# Patient Record
Sex: Female | Born: 1980 | Hispanic: No | Smoking: Former smoker
Health system: Southern US, Community
[De-identification: ages and names within clinical notes are randomized; demographics above are authoritative.]

## PROBLEM LIST (undated history)

## (undated) DIAGNOSIS — Z720 Tobacco use: Secondary | ICD-10-CM

## (undated) DIAGNOSIS — E282 Polycystic ovarian syndrome: Secondary | ICD-10-CM

## (undated) DIAGNOSIS — M1712 Unilateral primary osteoarthritis, left knee: Secondary | ICD-10-CM

## (undated) DIAGNOSIS — E785 Hyperlipidemia, unspecified: Secondary | ICD-10-CM

## (undated) DIAGNOSIS — F329 Major depressive disorder, single episode, unspecified: Secondary | ICD-10-CM

## (undated) DIAGNOSIS — E119 Type 2 diabetes mellitus without complications: Secondary | ICD-10-CM

## (undated) DIAGNOSIS — I10 Essential (primary) hypertension: Secondary | ICD-10-CM

## (undated) HISTORY — DX: Type 2 diabetes mellitus without complications: E11.9

## (undated) HISTORY — DX: Essential (primary) hypertension: I10

## (undated) HISTORY — DX: Unilateral primary osteoarthritis, left knee: M17.12

## (undated) HISTORY — PX: HERNIA REPAIR: SHX51

## (undated) HISTORY — DX: Tobacco use: Z72.0

## (undated) HISTORY — DX: Hyperlipidemia, unspecified: E78.5

## (undated) HISTORY — DX: Polycystic ovarian syndrome: E28.2

---
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
## 2010-09-15 ENCOUNTER — Ambulatory Visit: Admit: 2010-09-15 | Payer: Self-pay | Admitting: Family Medicine

## 2010-09-15 ENCOUNTER — Encounter: Payer: Self-pay | Admitting: Family Medicine

## 2010-09-15 ENCOUNTER — Ambulatory Visit
Admission: RE | Admit: 2010-09-15 | Discharge: 2010-09-15 | Payer: Self-pay | Source: Home / Self Care | Attending: Family Medicine | Admitting: Family Medicine

## 2010-09-15 DIAGNOSIS — N979 Female infertility, unspecified: Secondary | ICD-10-CM | POA: Insufficient documentation

## 2010-09-15 DIAGNOSIS — N912 Amenorrhea, unspecified: Secondary | ICD-10-CM

## 2010-09-20 LAB — CONVERTED CEMR LAB
ALT: 20 units/L (ref 0–35)
AST: 19 units/L (ref 0–37)
Albumin: 3.6 g/dL (ref 3.5–5.2)
Alkaline Phosphatase: 71 units/L (ref 39–117)
BUN: 8 mg/dL (ref 6–23)
CO2: 27 meq/L (ref 19–32)
Calcium: 8.7 mg/dL (ref 8.4–10.5)
Chloride: 103 meq/L (ref 96–112)
Creatinine, Ser: 0.64 mg/dL (ref 0.40–1.20)
DHEA-SO4: 116 ug/dL (ref 35–430)
Estradiol: 37 pg/mL
FSH: 5.7 milliintl units/mL
Glucose, Bld: 96 mg/dL (ref 70–99)
HCT: 40.4 % (ref 36.0–46.0)
Hemoglobin: 13.3 g/dL (ref 12.0–15.0)
Hgb A1c MFr Bld: 6.3 % — ABNORMAL HIGH (ref <5.7)
LH: 7.2 milliintl units/mL
MCHC: 32.9 g/dL (ref 30.0–36.0)
MCV: 79.7 fL (ref 78.0–100.0)
Platelets: 263 10*3/uL (ref 150–400)
Potassium: 4.2 meq/L (ref 3.5–5.3)
Prolactin: 7.3 ng/mL
RBC: 5.07 M/uL (ref 3.87–5.11)
RDW: 15.2 % (ref 11.5–15.5)
Sodium: 140 meq/L (ref 135–145)
TSH: 4.15 microintl units/mL (ref 0.350–4.500)
Testosterone: 70.36 ng/dL — ABNORMAL HIGH (ref 10–70)
Total Bilirubin: 0.5 mg/dL (ref 0.3–1.2)
Total Protein: 7.1 g/dL (ref 6.0–8.3)
WBC: 9.8 10*3/uL (ref 4.0–10.5)

## 2010-10-03 ENCOUNTER — Other Ambulatory Visit: Payer: Self-pay | Admitting: Obstetrics & Gynecology

## 2010-10-03 ENCOUNTER — Ambulatory Visit
Admission: RE | Admit: 2010-10-03 | Discharge: 2010-10-03 | Payer: Self-pay | Source: Home / Self Care | Attending: Obstetrics & Gynecology | Admitting: Obstetrics & Gynecology

## 2010-10-19 NOTE — Assessment & Plan Note (Signed)
Summary: NOV: Amenorrhea   Vital Signs:  Patient profile:   30 year old female Height:      64 inches Weight:      326 pounds Pulse rate:   100 / minute BP sitting:   126 / 81  (right arm) Cuff size:   regular  Vitals Entered By: Avon Gully CMA, Duncan Dull) (September 15, 2010 8:34 AM) CC: NP-est care, trying to get preg,irreg periods   CC:  NP-est care, trying to get preg, and irreg periods.  History of Present Illness: NP-est care, trying to get preg,irreg periods. Has been trying to get pregnant for about a year.  WEnt 5 months without a periods. Then had a very light periods in October and then got a heavy period in December that lasted 2 weeks.  Inc hair growth.  Has had irregular periods most of her adult life. She does have a 31 year old and says didn't have a period for almost a year and then got pregnant.  She is not on a PNV. She recently got married and really would like to have another child. Has occ spotting after intercourse.   Habits & Providers  Alcohol-Tobacco-Diet     Alcohol drinks/day: 0     Tobacco Status: quit     Year Quit: 2011  Exercise-Depression-Behavior     Does Patient Exercise: yes     Type of exercise: zumba     Exercise (avg: min/session): 30-60     Times/week: 3     Drug Use: no     Seat Belt Use: always  Current Medications (verified): 1)  None  Allergies (verified): 1)  ! Amoxicillin (Amoxicillin) 2)  ! Penicillin G Potassium (Penicillin G Potassium) 3)  ! Erythromycin  Comments:  Nurse/Medical Assistant: The patient's medications and allergies were reviewed with the patient and were updated in the Medication and Allergy Lists. 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  Married to [place]  with one son.   Former Smoker Alcohol use-no Drug use-no Regular exercise-yes Smoking Status:  quit Does Patient Exercise:  yes Drug Use:  no Seat Belt Use:  always  Review of Systems       No fever/sweats/weakness, unexplained weight loss/gain.  No vison changes.  No difficulty hearing/ringing in ears, hay fever/allergies.  No chest pain/discomfort, palpitations.  No Br lump/nipple discharge.  No cough/wheeze.  No blood in BM, nausea/vomiting/diarrhea.  No nighttime urination, leaking urine, + unusual vaginal bleeding, no discharge (vagina).  No muscle/joint pain. No rash, change in mole.  No HA, memory loss.  No anxiety, sleep d/o, depression.  No easy bruising/bleeding, unexplained lump   Physical Exam  General:  Well-developed,well-nourished,in no acute distress; alert,appropriate and cooperative throughout examination. She is obese.  Neck:  No deformities, masses, or tenderness noted. NO TM.  Lungs:  Normal respiratory effort, chest expands symmetrically. Lungs are clear to auscultation, no crackles or wheezes. Heart:  Normal rate and regular rhythm. S1 and S2 normal without gallop, murmur, click, rub or other extra sounds. Abdomen:  Bowel sounds positive,abdomen soft and non-tender without masses, organomegaly or hernias noted. Skin:  no rashes.   Cervical Nodes:  No lymphadenopathy noted Psych:  Cognition and judgment appear intact. Alert and cooperative with normal attention span and concentration. No apparent  delusions, illusions, hallucinations   Impression & Recommendations:  Problem # 1:  AMENORRHEA (ICD-626.0) I suspect she really has PCOS since she has had irregular periods most of her life and she has been overweight most of her life.  Also she says she gets facial hair growth. Not sure if any insulin resistance but we will check. Also will check for anemia. She does have a family hx of thyroid d/o.  Will also refer to GYN since she has been trying for a year and she also  needs a pap. Offered to do one hear but likely gyn will want to do a pelvic exam on her anyway so will defer testing to them.  Orders: T-TSH [phone]) T-Comprehensive Metabolic Panel [phone]) T-CBC No Diff ([id number]) T- Hemoglobin A1C ([id number]) T-FSH [phone]) T-LH [phone]) T-Estradiol [phone]) T-Prolactin [phone]) T-Dehydroepiandrosterone-Sulfate (DHEA S) [phone]) T-Testosterone; Total [phone]) Gynecologic Referral (Gyn)  Patient Instructions: 1)  Start a once daily prenatla vitamin at bedtime 2)  Continue your exercise and healthy diet to try to lose weight.    Orders Added: 1)  T-TSH [[id number]] 2)  T-Comprehensive Metabolic Panel [[id number]] 3)  T-CBC No Diff [[id number]] 4)  T- Hemoglobin A1C [83036-23375] 5)  T-FSH [83001-23670] 6)  T-LH [[id number]] 7)  T-Estradiol [82670-82425] 8)  T-Prolactin [35573-22025] 9)  T-Dehydroepiandrosterone-Sulfate (DHEA S) [[id number]] 10)  T-Testosterone; Total [phone] 11)  Gynecologic Referral [Gyn] 12)  New Patient Level III [99203]

## 2010-11-08 ENCOUNTER — Other Ambulatory Visit: Payer: Self-pay | Admitting: Obstetrics & Gynecology

## 2010-11-08 ENCOUNTER — Ambulatory Visit
Admission: RE | Admit: 2010-11-08 | Discharge: 2010-11-08 | Disposition: A | Discharge status: Home / Self Care | Payer: BC Managed Care – PPO | Source: Ambulatory Visit | Attending: Obstetrics & Gynecology | Admitting: Obstetrics & Gynecology

## 2010-11-08 ENCOUNTER — Ambulatory Visit: Payer: BC Managed Care – PPO | Admitting: Obstetrics & Gynecology

## 2010-11-08 DIAGNOSIS — N930 Postcoital and contact bleeding: Secondary | ICD-10-CM

## 2010-12-10 ENCOUNTER — Other Ambulatory Visit: Payer: Self-pay | Admitting: Family Medicine

## 2011-01-24 ENCOUNTER — Telehealth: Payer: Self-pay | Admitting: Family Medicine

## 2011-01-24 NOTE — Telephone Encounter (Signed)
Pt called and was diagnosed in Feb 2012 with polycystic ovarian syndrome and now 2 days ago was having sexual intercourse and had what is called normal bleeding with this diagnosis and today woke up and had sharp pain in lower abdominal area on RT side.  Ultrasound done in Frb 2012 and our office had referred pt to the OB-GYN. Plan:  Pt notified after discussing situation to call her OB)GYn provider to follow up.  Pt voiced understanding. Jarvis Newcomer, LPN Domingo Dimes

## 2011-01-30 NOTE — Assessment & Plan Note (Signed)
Deborah Carrillo, Deborah Carrillo              ACCOUNT NO.:  [id number]   MEDICAL RECORD NO.:  [id number]          PATIENT TYPE:  POB   LOCATION:  [facility] [place]         FACILITY:  [facility]   PHYSICIAN:  Allie Bossier, MD        DATE OF BIRTH:  Jan 30, 1981   DATE OF SERVICE:  10/03/2010                                  CLINIC NOTE   Ms. Kreager is a 30 year old married white G1, P1.  She has a 10-year-  old son named Immunologist.  She has been married to her current husband for  last three and half months and would like to achieve pregnancy.  She has  been having unprotected intercourse with this man (who has already  fathered a child 5 years ago) for the last approximately a year and 4  months.  She says her periods are extremely irregular and they have been  her whole life.  She reports less than 6 periods in 2011.  She saw Dr.  Linford Arnold recently and was given a diagnosis of high sugar and started  on metformin.  Dr. Linford Arnold mentioned that she perhaps has polycystic  ovarian syndrome.  The patient comes today also with the complaint of  some bleeding after intercourse.  She says it occurs usually the next  day or so.   PAST MEDICAL HISTORY:  Morbid obesity, what sounds like adult onset  diabetes and I am suspicious for history of PCOS.   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  She  reports dyspareunia with this partner but not with previous partners.   SOCIAL HISTORY:  Negative for tobacco, alcohol, or drug use.  Her last  Pap smear was more than 2 years ago.   ALLERGIES:  No latex allergies.  Drug allergies to PENICILLIN,  AMOXICILLIN, and ERYTHROMYCIN.   MEDICATIONS:  She was started on metformin 500 mg b.i.d. last week.  She  was started prenatal vitamins last week and she takes Aleve on a p.r.n.  (rare) basis.   FAMILY HISTORY:  She denies a family history of abnormal Pap smears.   PHYSICAL EXAMINATION:  VITAL SIGNS:  Height 5 feet 4 inches, weight 330  pounds,  blood pressure 140/89, pulse 71.  HEENT:  Normal.  BREAST:  No masses, no skin changes or nipple discharge.  ABDOMEN:  Morbidly obese.  No palpable hepatosplenomegaly.  There is no  excessive hirsutism on her abdomen.  EXTERNAL GENITALIA:  No lesions.  Cervix appears nulliparous with a  normal discharge.  She has normal estrogen effect.  Bimanual exam was  noncontributory due to her centripetal obesity.   ASSESSMENT AND PLAN:  1. Annual exam.  I have checked Pap smear.  Recommended self-breast      and self-vulvar exams.  I gave her handout on self-breast exam.  2. Morbid obesity.  I have discussed weight watchers and the fact that      weight loss will definitely improve her quality of life as well as      her longevity and her chance of fertility.  3. Desire for pregnancy.  I have recommended she return here next  month and that we will increase her metformin.  She understands      that in addition to lowering sugars, it also can be used as an      ovulation induction agent.      Allie Bossier, MD     MCD/MEDQ  D:  10/03/2010  T:  10/04/2010  Job:  [id number]

## 2011-04-30 ENCOUNTER — Ambulatory Visit (INDEPENDENT_AMBULATORY_CARE_PROVIDER_SITE_OTHER): Payer: BC Managed Care – PPO | Admitting: Family Medicine

## 2011-04-30 VITALS — BP 146/96 | HR 112 | Wt 315.0 lb

## 2011-04-30 DIAGNOSIS — Z3202 Encounter for pregnancy test, result negative: Secondary | ICD-10-CM

## 2011-04-30 DIAGNOSIS — Z3201 Encounter for pregnancy test, result positive: Secondary | ICD-10-CM

## 2011-04-30 LAB — POCT URINE PREGNANCY: Preg Test, Ur: POSITIVE

## 2011-04-30 NOTE — Progress Notes (Signed)
  Subjective:    Patient ID: Deborah Carrillo, female    DOB: 1981/03/25, 30 y.o.   MRN: [id number]  HPI Had 3 pos home preg tests and wanted confirmation from here.     Review of Systems     Objective:   Physical Exam        Assessment & Plan:  UPT + . LMP was Carrillo. Hx of PCOS. Will get quant.

## 2011-05-01 ENCOUNTER — Telehealth: Payer: Self-pay | Admitting: Family Medicine

## 2011-05-01 DIAGNOSIS — Z331 Pregnant state, incidental: Secondary | ICD-10-CM

## 2011-05-01 LAB — HCG, QUANTITATIVE, PREGNANCY: hCG, Beta Chain, Quant, S: 543.5 m[IU]/mL

## 2011-05-01 NOTE — Telephone Encounter (Signed)
Call patient: Her hCG is positive. Initially she is approximately in the 3 week range. We did schedule her for an ultrasound to further confirm her dating since her last period was in May.

## 2011-05-01 NOTE — Telephone Encounter (Signed)
Pt aware of the above  

## 2011-05-18 ENCOUNTER — Ambulatory Visit
Admission: RE | Admit: 2011-05-18 | Discharge: 2011-05-18 | Disposition: A | Discharge status: Home / Self Care | Payer: BC Managed Care – PPO | Source: Ambulatory Visit | Attending: Family Medicine | Admitting: Family Medicine

## 2011-05-18 ENCOUNTER — Other Ambulatory Visit: Payer: Self-pay | Admitting: Family Medicine

## 2011-05-18 ENCOUNTER — Telehealth: Payer: Self-pay | Admitting: Family Medicine

## 2011-05-18 DIAGNOSIS — Z331 Pregnant state, incidental: Secondary | ICD-10-CM

## 2011-05-18 DIAGNOSIS — Z3201 Encounter for pregnancy test, result positive: Secondary | ICD-10-CM

## 2011-05-18 NOTE — Telephone Encounter (Signed)
Call pt: Gestational sac present on [place] but no yolk sac. The recommend repeat US in one week to confim that the yolk sac is present.

## 2011-05-22 ENCOUNTER — Telehealth: Payer: Self-pay | Admitting: Family Medicine

## 2011-05-22 NOTE — Telephone Encounter (Signed)
Pt called and spoke with triage to get ultrasound results. Plan:  Pt notified by [facility] that gestational sac seen but need to repeat the test in 1 week to check for yolk sac.  Told to call with questions or concerns once message received. Jarvis Newcomer, LPN Domingo Dimes

## 2011-05-23 NOTE — Telephone Encounter (Signed)
Left message to call back  

## 2011-05-24 ENCOUNTER — Telehealth: Payer: Self-pay | Admitting: Family Medicine

## 2011-05-24 NOTE — Telephone Encounter (Signed)
Pt called and stated she has her second ultrasound set up for tomorrow. Jarvis Newcomer, LPN Domingo Dimes

## 2011-05-25 ENCOUNTER — Telehealth: Payer: Self-pay | Admitting: Family Medicine

## 2011-05-25 ENCOUNTER — Ambulatory Visit
Admission: RE | Admit: 2011-05-25 | Discharge: 2011-05-25 | Disposition: A | Discharge status: Home / Self Care | Payer: BC Managed Care – PPO | Source: Ambulatory Visit | Attending: Family Medicine | Admitting: Family Medicine

## 2011-05-25 DIAGNOSIS — O200 Threatened abortion: Secondary | ICD-10-CM

## 2011-05-25 DIAGNOSIS — Z3201 Encounter for pregnancy test, result positive: Secondary | ICD-10-CM

## 2011-05-25 NOTE — Telephone Encounter (Signed)
Call patient: The ultrasound shows that she has a failed pregnancy. I would like to refer her to OB down the hall. She will likely expect some spotting and bleeding in the next week or 2.

## 2011-05-25 NOTE — Telephone Encounter (Signed)
Pt.notified

## 2011-05-28 NOTE — Telephone Encounter (Signed)
Closed

## 2011-05-29 ENCOUNTER — Ambulatory Visit (INDEPENDENT_AMBULATORY_CARE_PROVIDER_SITE_OTHER): Payer: BC Managed Care – PPO | Admitting: Obstetrics & Gynecology

## 2011-05-29 ENCOUNTER — Other Ambulatory Visit: Payer: Self-pay | Admitting: Obstetrics & Gynecology

## 2011-05-29 ENCOUNTER — Encounter: Payer: Self-pay | Admitting: Obstetrics & Gynecology

## 2011-05-29 VITALS — BP 118/86 | HR 88 | Temp 98.5°F | Resp 17 | Ht 63.0 in | Wt 324.0 lb

## 2011-05-29 DIAGNOSIS — O021 Missed abortion: Secondary | ICD-10-CM

## 2011-05-29 MED ORDER — MISOPROSTOL 200 MCG PO TABS: ORAL_TABLET | ORAL | Status: DC

## 2011-05-29 MED ORDER — HYDROCODONE-ACETAMINOPHEN 5-500 MG PO TABS: 1.0000 | ORAL_TABLET | ORAL | Status: DC | PRN

## 2011-05-29 NOTE — Progress Notes (Signed)
  Subjective:    Patient ID: Deborah Carrillo, female    DOB: May 01, 1981, 30 y.o.   MRN: [id number]  HPI  30 yo G2P1001 with blighted ovum on [place].   Pt states she is Rh negative.     Review of Systems  Constitutional: Negative.   Respiratory: Negative.   Cardiovascular: Negative.   Gastrointestinal: Negative.   Genitourinary: Positive for vaginal bleeding.  Neurological: Negative for dizziness.       Objective:   Physical Exam  Constitutional: She is oriented to person, place, and time. She appears well-developed and well-nourished.  HENT:  Head: Normocephalic and atraumatic.  Pulmonary/Chest: Effort normal.  Abdominal: Soft.  Genitourinary: Vagina normal and uterus normal.       Cervix closed.  Small amt of blood in vault  Neurological: She is alert and oriented to person, place, and time.  Skin: Skin is warm and dry.  Psychiatric: She has a normal mood and affect.          Assessment & Plan:  30 yo G2P1001 with blighted ovum on [place].  1.  Cytotec protocol 2.  CBC, ABO Rh 3.  Cytotec and Vicodin Rx given. 4.  Aleve for mild pain. 5.  RTC 2 weeks

## 2011-05-30 LAB — CBC
HCT: 37.8 % (ref 36.0–46.0)
RDW: 16.4 % — ABNORMAL HIGH (ref 11.5–15.5)
WBC: 9.6 10*3/uL (ref 4.0–10.5)

## 2011-05-31 LAB — ABO AND RH: Rh Type: NEGATIVE

## 2011-06-01 ENCOUNTER — Other Ambulatory Visit (INDEPENDENT_AMBULATORY_CARE_PROVIDER_SITE_OTHER): Payer: BC Managed Care – PPO

## 2011-06-01 DIAGNOSIS — O021 Missed abortion: Secondary | ICD-10-CM

## 2011-06-01 MED ORDER — RHO D IMMUNE GLOBULIN 1500 UNIT/2ML IJ SOLN
300.0000 ug | Freq: Once | INTRAMUSCULAR | Status: AC
  Administered 2011-06-01: 300 ug via INTRAMUSCULAR

## 2011-06-01 NOTE — Progress Notes (Signed)
Pt's ABO Rh is B neg.  Phothpylac  1500 IU given LUOQ IM.  See MAR  For Lot and exp date.  Pt is f/u appt in 2 weeks

## 2011-06-12 ENCOUNTER — Other Ambulatory Visit (INDEPENDENT_AMBULATORY_CARE_PROVIDER_SITE_OTHER): Payer: BC Managed Care – PPO

## 2011-06-12 DIAGNOSIS — O039 Complete or unspecified spontaneous abortion without complication: Secondary | ICD-10-CM

## 2011-06-12 NOTE — Progress Notes (Signed)
Pt here for lab only BHCG in f/u of [facility] per Dr Penne Lash

## 2011-06-13 LAB — HCG, QUANTITATIVE, PREGNANCY: hCG, Beta Chain, Quant, S: 7.7 m[IU]/mL

## 2011-08-01 ENCOUNTER — Ambulatory Visit: Payer: BC Managed Care – PPO | Admitting: Obstetrics & Gynecology

## 2011-11-14 ENCOUNTER — Encounter: Payer: Self-pay | Admitting: Obstetrics & Gynecology

## 2011-11-14 ENCOUNTER — Ambulatory Visit (INDEPENDENT_AMBULATORY_CARE_PROVIDER_SITE_OTHER): Payer: BC Managed Care – PPO | Admitting: Obstetrics & Gynecology

## 2011-11-14 VITALS — BP 126/83 | HR 88 | Temp 97.9°F | Resp 17 | Ht 63.0 in | Wt 330.0 lb

## 2011-11-14 DIAGNOSIS — N912 Amenorrhea, unspecified: Secondary | ICD-10-CM

## 2011-11-14 MED ORDER — PRENATAL FORMULA 27-1 MG PO TABS: 1.0000 | ORAL_TABLET | Freq: Every day | ORAL | Status: AC

## 2011-11-14 MED ORDER — CLOMIPHENE CITRATE 50 MG PO TABS: 50.0000 mg | ORAL_TABLET | Freq: Every day | ORAL | Status: AC

## 2011-11-14 NOTE — Progress Notes (Signed)
  Subjective:    Patient ID: Deborah Carrillo, female    DOB: [birth year], 31 y.o.   MRN: [id number]  HPI  Pt here for irregular menses and desire to become pregnant.  Pt is s/p cytotec for miscarriage in September 2012.  They have been trying to become pregnant since.  Pt is obese and most likely has PCO.   Will check for DM today.  If pt not pregnant, pt would like to try Clomid.  Pt given detailed instructions for taking.    Review of Systems  Constitutional: Negative.   Respiratory: Negative.   Cardiovascular: Negative.   Gastrointestinal: Negative.   Genitourinary: Positive for menstrual problem.       Objective:   Physical Exam  Vitals reviewed. Constitutional: She appears well-developed and well-nourished. No distress.  HENT:  Head: Normocephalic and atraumatic.  Eyes: Conjunctivae are normal.  Pulmonary/Chest: Breath sounds normal.  Abdominal: Soft.  Musculoskeletal:       Walks without assistance  Skin: Skin is warm and dry.  Psychiatric: She has a normal mood and affect.          Assessment & Plan:  31 yo G2 P1011 with oligomenorrhea and most likely PCO  1-Test for DM today 2-PNV 3-pregnancy test 4-Clomid  RTC 3 months

## 2011-11-15 LAB — HCG, SERUM, QUALITATIVE: Preg, Serum: NEGATIVE

## 2011-11-16 ENCOUNTER — Telehealth: Payer: Self-pay | Admitting: *Deleted

## 2011-11-16 NOTE — Telephone Encounter (Signed)
Lm on cell phone that her hgb A1C was elevated and that she does need to come in fasting for a 2 hr GTT.  She is not to attempt pregnancy until it is determined whether or not she has diabetes as this could be harmful to a pregnancy is she needed to be on some glucose lowering medication.  Pt is to call office to schedule.

## 2011-11-19 ENCOUNTER — Other Ambulatory Visit: Payer: BC Managed Care – PPO

## 2011-11-19 ENCOUNTER — Other Ambulatory Visit: Payer: Self-pay | Admitting: *Deleted

## 2011-11-19 DIAGNOSIS — R7309 Other abnormal glucose: Secondary | ICD-10-CM

## 2011-11-20 LAB — GLUCOSE TOLERANCE, 2 HOURS W/ 1HR
Glucose, 1 hour: 233 mg/dL — ABNORMAL HIGH (ref 70–170)
Glucose, Fasting: 107 mg/dL — ABNORMAL HIGH (ref 70–99)

## 2011-11-21 ENCOUNTER — Telehealth: Payer: Self-pay | Admitting: *Deleted

## 2011-11-21 NOTE — Telephone Encounter (Signed)
Dr. Linford ArnoldMervyn Gay sent this to me and wants you to review this patients labs and meds for diabetes.

## 2011-11-21 NOTE — Telephone Encounter (Signed)
Message copied by Lanae Crumbly on Wed Nov 21, 2011  2:25 PM ------      Message from: [place], [place] L      Created: Wed Nov 21, 2011  2:01 PM      Regarding: Labs       Denver Faster,      This is a gyn pt who also is seen there.  She wanted to try and conceive but Dr; leggett wanted her to have a 2hr GTT done first.  It is elevated and appears that she has type 2 diabetes.  Her labs are in EPIC.  Will you have your review and contact her about hypoglycemic meds.      Call me with any questions.      Thanks,      Mervyn Gay

## 2011-11-21 NOTE — Telephone Encounter (Signed)
OK have her sched appt with med. We can do A1C and discuss meds and diet.

## 2011-11-23 ENCOUNTER — Ambulatory Visit (INDEPENDENT_AMBULATORY_CARE_PROVIDER_SITE_OTHER): Payer: BC Managed Care – PPO | Admitting: Family Medicine

## 2011-11-23 ENCOUNTER — Encounter: Payer: Self-pay | Admitting: Family Medicine

## 2011-11-23 VITALS — BP 140/90 | HR 81 | Ht 62.0 in | Wt 332.0 lb

## 2011-11-23 DIAGNOSIS — E8881 Metabolic syndrome: Secondary | ICD-10-CM

## 2011-11-23 DIAGNOSIS — R5381 Other malaise: Secondary | ICD-10-CM

## 2011-11-23 DIAGNOSIS — E282 Polycystic ovarian syndrome: Secondary | ICD-10-CM | POA: Insufficient documentation

## 2011-11-23 DIAGNOSIS — E669 Obesity, unspecified: Secondary | ICD-10-CM | POA: Insufficient documentation

## 2011-11-23 DIAGNOSIS — R5383 Other fatigue: Secondary | ICD-10-CM

## 2011-11-23 DIAGNOSIS — E118 Type 2 diabetes mellitus with unspecified complications: Secondary | ICD-10-CM | POA: Insufficient documentation

## 2011-11-23 DIAGNOSIS — E119 Type 2 diabetes mellitus without complications: Secondary | ICD-10-CM | POA: Insufficient documentation

## 2011-11-23 MED ORDER — METFORMIN HCL 500 MG PO TABS: 500.0000 mg | ORAL_TABLET | Freq: Two times a day (BID) | ORAL | Status: DC

## 2011-11-23 NOTE — Patient Instructions (Signed)
1200 Calorie Diabetic Diet The 1200 calorie diabetic diet limits calories to 1200 each day. Following this diet and making healthy meal choices can help improve overall health. It controls blood glucose (sugar) levels and can also help lower blood pressure and cholesterol.  SERVING SIZES Measuring foods and serving sizes helps to make sure you are getting the right amount of food. The list below tells how big or small some common serving sizes are.   1 oz.........4 stacked dice.   3 oz.........Deck of cards.   1 tsp........Tip of little finger.   1 tbs........Thumb.   2 tbs........Golf ball.    cup.......Half of a fist.   1 cup........A fist.  GUIDELINES FOR CHOOSING FOODS The goal of this diet is to eat a variety of foods and limit calories to 1200 each day. This can be done by choosing foods that are low in calories and fat. The diet also suggests eating small amounts of food frequently. Doing this helps control your blood glucose levels, so they do not get too high or too low. Each meal or snack may include a protein food source to help you feel more satisfied. Try to eat about the same amount of food around the same time each day. This includes weekend days, travel days, and days off work. Space your meals about 4 to 5 hours apart, and add a snack between them, if you wish.  For example, a daily food plan could include breakfast, a morning snack, lunch, dinner, and an evening snack. Healthy meals and snacks have different types of foods, including whole grains, vegetables, fruits, lean meats, poultry, fish, and dairy products. As you plan your meals, select a variety of foods. Choose from the bread and starch, vegetable, fruit, dairy, and meat/protein groups. Examples of foods from each group are listed below, with their suggested serving sizes. Use measuring cups and spoons to become familiar with what a healthy portion looks like. Bread and Starch Each serving equals 15 grams of  carbohydrate.  1 slice bread.    bagel.    cup cold cereal (unsweetened).    cup hot cereal or mashed potatoes.   1 small potato (size of a computer mouse).   ? cup cooked pasta or rice.    English muffin.   1 cup broth-based soup.   3 cups of popcorn.   4 to 6 whole-wheat crackers.    cup cooked beans, peas, or corn.  Vegetables Each serving equals 5 grams of carbohydrate.   cup cooked vegetables.   1 cup raw vegetables.    cup tomato or vegetable juice.  Fruit Each serving equals 15 grams of carbohydrate.  1 small apple or orange.   1  cup watermelon or strawberries.    cup applesauce (no sugar added).   2 tbs raisins.    banana.    cup canned fruit, packed in water or in its own juice.    cup unsweetened fruit juice.  Dairy Each serving equals 12 to 15 grams of carbohydrate.  1 cup fat-free milk.   6 oz artificially sweetened yogurt or plain yogurt.   1 cup low-fat buttermilk.   1 cup soy milk.   1 cup almond milk.  Meat/Protein  1 large egg.   2 to 3 oz meat, poultry, or fish.    cup low-fat cottage cheese.   1 tbs peanut butter.   1 oz low-fat cheese.    cup tuna, packed in water.    cup tofu.    Fat  1 tsp oil.   1 tsp trans-fat-free margarine.   1 tsp butter.   1 tsp mayonnaise.   2 tbs avocado.   1 tbs salad dressing.   1 tbs cream cheese.   2 tbs sour cream.  SAMPLE 1200 CALORIE DIET PLAN Breakfast  1 cup fat-free milk (1 carb serving).   1 small orange (1 carb serving).   1 scrambled egg.   1 slice whole-wheat toast (1 carb serving).  Lunch  Sandwich   2 slices whole-wheat bread (2 carb servings).   2 oz lean meat.   2 tsp reduced fat mayonnaise.   1 lettuce leaf.   2 slices tomato.   1 cup carrot sticks.  Afternoon Snack  1 small apple (1 carb serving).   1 string cheese.  Dinner  2 oz meat.   1 small baked potato (1 carb serving).   1 tsp trans-fat-free margarine.     1 cup steamed broccoli.   1 cup fat-free milk (1 carb serving).  Evening Snack   small banana (1 carb serving).   6 vanilla wafers (1 carb serving).  MEAL PLAN You can use this worksheet to help you make a daily meal plan based on the 1200 calorie diabetic diet suggestions. If you are using this plan to help you control your blood glucose, you may interchange carbohydrate containing foods (dairy, starches, and fruits). Select a variety of fresh foods of varying colors and flavors. The total amount of carbohydrate in your meals or snacks is more important than making sure you include all of the food groups every time you eat. You can choose from approximately this many of the following foods to build your day's meals:  6 Starches.   3 Vegetables.   2 Fruits.   2 Dairy.   4 to 6 oz Meat/Protein.   Up to 3 Fats.  Your dietician can use this worksheet to help you decide how many servings and which types of foods are right for you. BREAKFAST Food Group and Servings / Food Choice Starch _________________________________________________________ Dairy __________________________________________________________ Fruit ___________________________________________________________ Meat/Protein____________________________________________________ Fat ____________________________________________________________ LUNCH Food Group and Servings / Food Choice  Starch _________________________________________________________ Meat/Protein ___________________________________________________ Vegetables _____________________________________________________ Fruit __________________________________________________________ Dairy __________________________________________________________ Fat ____________________________________________________________ AFTERNOON SNACK Food Group and Servings / Food Choice Dairy __________________________________________________________ Fruit  ___________________________________________________________ Starch __________________________________________________________ Meat/Protein____________________________________________________ DINNER Food Group and Servings / Food Choice Starch _________________________________________________________ Meat/Protein ___________________________________________________ Dairy __________________________________________________________ Vegetables _____________________________________________________ Fruit __________________________________________________________ Fat ____________________________________________________________ EVENING SNACK Food Group and Servings / Food Choice Fruit ___________________________________________________________ Meat/Protein ____________________________________________________ Dairy __________________________________________________________ Starch __________________________________________________________ DAILY TOTALS Starches _________________________ Vegetables _______________________ Fruits ____________________________ Dairy ____________________________ Meat/Protein______________________ Fats _____________________________ Document Released: 03/26/2005 Document Revised: 08/23/2011 Document Reviewed: 07/21/2009 [employer] Patient Information 2012 [employer]. 

## 2011-11-23 NOTE — Progress Notes (Signed)
Subjective:    Patient ID: Deborah Carrillo, female    DOB: 1981-01-12, 31 y.o.   MRN: [id number]  HPI Has been on the slimfast 1-2-3 plan fro a couple of weeks. Says stays hungry all the time. Feels really fatigued all the time.  SHe is here to f/u from her ob because her fasting GGT was elevated.  She had a miscarriage last fall and would like to try to get pregnant again sometime this year. She and her OB discussed putting on hold for now so that she can get her sugars and weight under a little bit better control, and this will improve her fertility. She has also been working out for 30 minutes 3 days a week with a video at home. With her exercise adn dieting for the last 2 weeks she has only lost 1 lb. + fam hx of thyroid disease in her mother, who is hyperthyroid.   Review of Systems BP 140/90  Pulse 81  Ht 5\' 2"  (1.575 m)  Wt 332 lb (150.594 kg)  BMI 60.72 kg/m2  LMP 10/14/2011    Allergies  Allergen Reactions  . Amoxicillin   . Erythromycin   . Penicillins     Past Medical History  Diagnosis Date  . Polycystic ovary disease     Past Surgical History  Procedure Date  . Cesarean section 2001  . Tympanoplasty 1988    History   Social History  . Marital Status: Married    Spouse Name: N/A    Number of Children: N/A  . Years of Education: N/A   Occupational History  . daycare worker    Social History Main Topics  . Smoking status: Former Smoker -- 1.0 packs/day for 14 years    Types: Cigarettes    Quit date: 03/28/2011  . Smokeless tobacco: Never Used  . Alcohol Use: No  . Drug Use: No  . Sexually Active: Yes -- Female partner(s)   Other Topics Concern  . Not on file   Social History Narrative  . No narrative on file    Family History  Problem Relation Age of Onset  . Diabetes Maternal Grandmother           Objective:   Physical Exam  Constitutional: She is oriented to person, place, and time. She appears well-developed and well-nourished.  HENT:   Head: Normocephalic and atraumatic.  Cardiovascular: Normal rate, regular rhythm and normal heart sounds.   Pulmonary/Chest: Effort normal and breath sounds normal.  Neurological: She is alert and oriented to person, place, and time.  Skin: Skin is warm and dry.  Psychiatric: She has a normal mood and affect. Her behavior is normal.          Assessment & Plan:  Insulin resistance - Discussed options. Will start with metformin 500mg  bid. Discussed potential SE adn working on her weight. I explained that since she is not diabetic but is insulin resistant as she can still benefit from the metformin. It Carrillo even help her with weight loss. She has started a diet and exercise program.  She is eating 1200 calories per day. Avoid concentrated sweats and discussed portions sizes on carbs.   Lab Results  Component Value Date   HGBA1C 6.3* 11/14/2011   PCOS - meet all the criteria. We'll add diagnosis to her problem list.  Morbid obesity- Discussed working on weight loss. She has already made some steps in that direction. F/U in one month. If BP under better control then consider phentermine  for weight loss (will have to use condoms if on phentermine).   Check TSH, as she does have a family history.  Fatigue- Will check CBC, vitamin B12, vit D, and iron, TSh.   Elevated BP - will recheck at f/u in one month. Make sure eating a low salt diet.

## 2011-11-24 LAB — CBC
MCH: 26.2 pg (ref 26.0–34.0)
MCHC: 33.3 g/dL (ref 30.0–36.0)
MCV: 78.6 fL (ref 78.0–100.0)
Platelets: 277 10*3/uL (ref 150–400)
RBC: 5 MIL/uL (ref 3.87–5.11)
RDW: 16.1 % — ABNORMAL HIGH (ref 11.5–15.5)

## 2011-12-13 ENCOUNTER — Encounter: Payer: Self-pay | Admitting: *Deleted

## 2011-12-13 ENCOUNTER — Emergency Department (INDEPENDENT_AMBULATORY_CARE_PROVIDER_SITE_OTHER)
Admission: EM | Admit: 2011-12-13 | Discharge: 2011-12-13 | Disposition: A | Discharge status: Home / Self Care | Payer: BC Managed Care – PPO | Source: Home / Self Care | Attending: Emergency Medicine | Admitting: Emergency Medicine

## 2011-12-13 DIAGNOSIS — H109 Unspecified conjunctivitis: Secondary | ICD-10-CM

## 2011-12-13 MED ORDER — POLYMYXIN B-TRIMETHOPRIM 10000-0.1 UNIT/ML-% OP SOLN: 1.0000 [drp] | Freq: Four times a day (QID) | OPHTHALMIC | Status: AC

## 2011-12-13 NOTE — ED Notes (Signed)
Patient c/o left eye irritation x 2 days. C/o watering and some discharge over night. 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

## 2011-12-13 NOTE — ED Provider Notes (Signed)
The medical to make sure that he isHistory     CSN: [id number]  Arrival date & time 12/13/11  1124   First MD Initiated Contact with Patient 12/13/11 1139      Chief Complaint  Patient presents with  . Eye Problem    (Consider location/radiation/quality/duration/timing/severity/associated sxs/prior treatment) HPI Deborah Carrillo presents today with an EYE COMPLAINT.  Positive exposure to pinkeye at work.  No known trauma, however about 2 days ago she felt that she had something in her high that was scratchy.  Location: left  Onset: 2  Days   Symptoms: Redness: yes Discharge: yes Pain: no Photophobia: no Decreased Vision: no URI symptoms: yes Itching/Allergy sxs: yes Glaucoma: no Recent eye surgery: no Contact lens use: no  Red Flags Trauma: no Foreign Body: no Vomiting/HA: no Halos around lights: no Chickenpox or zoster: no     Past Medical History  Diagnosis Date  . Polycystic ovary disease     Past Surgical History  Procedure Date  . Cesarean section 2001  . Tympanoplasty 1988    Family History  Problem Relation Age of Onset  . Diabetes Maternal Grandmother   . Hypothyroidism Mother     History  Substance Use Topics  . Smoking status: Former Smoker -- 1.0 packs/day for 14 years    Types: Cigarettes    Quit date: 03/28/2011  . Smokeless tobacco: Never Used  . Alcohol Use: No    OB History    Grav Para Term Preterm Abortions TAB SAB Ect Mult Living   2 1 1       1       Review of Systems  All other systems reviewed and are negative.    Allergies  Amoxicillin; Erythromycin; and Penicillins  Home Medications   Current Outpatient Rx  Name Route Sig Dispense Refill  . METFORMIN HCL 500 MG PO TABS Oral Take 1 tablet (500 mg total) by mouth 2 (two) times daily with a meal. 60 tablet 3  . PRENATAL FORMULA 27-1 MG PO TABS Oral Take 1 tablet by mouth daily. 30 tablet 12  . POLYMYXIN B-TRIMETHOPRIM 10000-0.1 UNIT/ML-% OP SOLN Left Eye Place 1 drop  into the left eye every 6 (six) hours. 10 mL 0    BP 138/94  Pulse 89  Temp(Src) 99.2 F (37.3 C) (Oral)  Resp 14  Ht 5\' 2"  (1.575 m)  Wt 330 lb (149.687 kg)  BMI 60.36 kg/m2  SpO2 98%  LMP 10/14/2011  Physical Exam  Nursing note and vitals reviewed. Constitutional: She is oriented to person, place, and time. She appears well-developed and well-nourished.  HENT:  Head: Normocephalic and atraumatic.  Eyes: EOM are normal. Pupils are equal, round, and reactive to light. Left eye exhibits discharge and exudate. Left conjunctiva is injected. No scleral icterus.  Slit lamp exam:      The left eye shows no corneal abrasion, no corneal ulcer, no foreign body and no fluorescein uptake.  Neck: Neck supple.  Cardiovascular: Regular rhythm and normal heart sounds.   Pulmonary/Chest: Effort normal and breath sounds normal. No respiratory distress.  Neurological: She is alert and oriented to person, place, and time.  Skin: Skin is warm and dry.  Psychiatric: She has a normal mood and affect. Her speech is normal.    ED Course  Procedures (including critical care time)  Labs Reviewed - No data to display No results found.   1. Conjunctivitis       MDM   Fluoroscein examination was performed.  Diagnosis appears to be left bacterial conjunctivitis.  Prescription given for Polytrim and to use Q4 hours or and 22 to proceed with a.  Advised to throw out her recent eye makeup and use the drops as directed.  Good hand hygiene discussed and household cleaning to prevent spread in her family.  She is not working currently.  Followup with PCP or ophthalmology if not improving.     so ago it is going to be 1while in  Marlaine Hind, MD 12/13/11 1205

## 2011-12-17 ENCOUNTER — Telehealth: Payer: Self-pay | Admitting: *Deleted

## 2011-12-17 NOTE — ED Notes (Unsigned)
Pt called states that her eye is no better and that now both eyes appear to be infected. Advised her to call opthalmology to schedule appt in the AM. Pt agrees.

## 2011-12-21 ENCOUNTER — Encounter: Payer: Self-pay | Admitting: *Deleted

## 2011-12-24 ENCOUNTER — Encounter: Payer: Self-pay | Admitting: Family Medicine

## 2011-12-24 ENCOUNTER — Ambulatory Visit (INDEPENDENT_AMBULATORY_CARE_PROVIDER_SITE_OTHER): Payer: BC Managed Care – PPO | Admitting: Family Medicine

## 2011-12-24 VITALS — BP 147/97 | HR 57 | Ht 62.0 in | Wt 330.0 lb

## 2011-12-24 DIAGNOSIS — N912 Amenorrhea, unspecified: Secondary | ICD-10-CM

## 2011-12-24 DIAGNOSIS — I10 Essential (primary) hypertension: Secondary | ICD-10-CM

## 2011-12-24 DIAGNOSIS — Z13220 Encounter for screening for lipoid disorders: Secondary | ICD-10-CM

## 2011-12-24 DIAGNOSIS — E669 Obesity, unspecified: Secondary | ICD-10-CM

## 2011-12-24 MED ORDER — LISINOPRIL-HYDROCHLOROTHIAZIDE 10-12.5 MG PO TABS: 1.0000 | ORAL_TABLET | Freq: Every day | ORAL | Status: DC

## 2011-12-24 NOTE — Progress Notes (Signed)
  Subjective:    Patient ID: Deborah Carrillo, female    DOB: [birth year], 31 y.o.   MRN: [id number]  HPI HTN - No CP os SOB.  BP has been high the last several OV.  Here to recheck.  She has really avoided salt.    Obesity - dtill doing her exercise video. But out sugar beverages in her diet. Went hiking last weekend.  Has been trying ot be active. Still hasn't lost any weight.  Low energy. Still hasn't had her period in 3 months. Would like a UPT.    Review of Systems     Objective:   Physical Exam  Constitutional: She is oriented to person, place, and time. She appears well-developed and well-nourished.  HENT:  Head: Normocephalic and atraumatic.  Cardiovascular: Normal rate, regular rhythm and normal heart sounds.   Pulmonary/Chest: Effort normal and breath sounds normal.  Neurological: She is alert and oriented to person, place, and time.  Skin: Skin is warm and dry.  Psychiatric: She has a normal mood and affect. Her behavior is normal.          Assessment & Plan:  HTN - elevated again today. Has been elevated in the last several office visits. We will go and start lisinopril HCTZ. Continue work on low salt diet. Followup in 3 weeks to make sure she cycle. If at goal at that point in time then we can consider starting the phentermine for weight loss. I still strongly encouraged her to get into an exercise routine.  Obesity- Will consider phentermine if not pregnant and when BP is under control.  I still strongly encouraged her to get into an exercise routine.  She is using work out videos right now.    No period since January - will check UPT.  UPT is neg.   She will check on the Tdap. Not sure is due or not.   Due to lipid screen. Givne H.O for lab.

## 2012-01-14 ENCOUNTER — Ambulatory Visit (INDEPENDENT_AMBULATORY_CARE_PROVIDER_SITE_OTHER): Payer: BC Managed Care – PPO | Admitting: Family Medicine

## 2012-01-14 ENCOUNTER — Encounter: Payer: Self-pay | Admitting: Family Medicine

## 2012-01-14 VITALS — BP 120/70 | HR 91 | Temp 98.6°F | Ht 62.0 in | Wt 325.0 lb

## 2012-01-14 DIAGNOSIS — E282 Polycystic ovarian syndrome: Secondary | ICD-10-CM

## 2012-01-14 DIAGNOSIS — E669 Obesity, unspecified: Secondary | ICD-10-CM

## 2012-01-14 DIAGNOSIS — Z13220 Encounter for screening for lipoid disorders: Secondary | ICD-10-CM

## 2012-01-14 DIAGNOSIS — I10 Essential (primary) hypertension: Secondary | ICD-10-CM

## 2012-01-14 MED ORDER — PHENTERMINE HCL 37.5 MG PO CAPS: 37.5000 mg | ORAL_CAPSULE | ORAL | Status: DC

## 2012-01-14 NOTE — Patient Instructions (Signed)
See me in 3-4 months for blood pressure.

## 2012-01-14 NOTE — Progress Notes (Signed)
  Subjective:    Patient ID: Deborah Carrillo, female    DOB: 02/03/1981, 31 y.o.   MRN: [id number]  HPI She has joined a gym about 3 weeks ago. Has been working out 5 days per week.  She has lost 5 lbs.  She is taking the new BP pill and tolerating well.  No SE.  She has been dieting as well.  No CP or SOB.  Says feels better. Did feel a little lightheaded when first started working out but that has gotten better. She says she hydrates well. We did discuss increasing her protein intake as well.    Review of Systems     Objective:   Physical Exam  Constitutional: She is oriented to person, place, and time. She appears well-developed and well-nourished.  HENT:  Head: Normocephalic and atraumatic.  Cardiovascular: Normal rate, regular rhythm and normal heart sounds.   Pulmonary/Chest: Effort normal and breath sounds normal.  Neurological: She is alert and oriented to person, place, and time.  Skin: Skin is warm and dry.  Psychiatric: She has a normal mood and affect. Her behavior is normal.          Assessment & Plan:  HTN- Wll controlled. F/U in 3-4 months with me. If she continues to lose weight and I do not think she'll need to stay on a blood pressure pill. She is due for fasting lipid panel and already went this morning.  Obsiety - She would like to try phentermine as we had discussed at last OV.  Doing fantastic. Has lost 5 lbs.  followup in one month for nurse blood pressure and weight check. Continue to work out regularly and continue work on diet. I think she will do fantastic with this regimen. They we will have to monitor blood pressure carefully.

## 2012-01-15 LAB — LIPID PANEL
Cholesterol: 180 mg/dL (ref 0–200)
Total CHOL/HDL Ratio: 4.7 Ratio
Triglycerides: 138 mg/dL (ref <150)

## 2012-02-13 ENCOUNTER — Ambulatory Visit (INDEPENDENT_AMBULATORY_CARE_PROVIDER_SITE_OTHER): Payer: BC Managed Care – PPO | Admitting: Family Medicine

## 2012-02-13 VITALS — BP 125/74 | HR 74 | Wt 318.0 lb

## 2012-02-13 DIAGNOSIS — R635 Abnormal weight gain: Secondary | ICD-10-CM

## 2012-02-13 MED ORDER — PHENTERMINE HCL 37.5 MG PO CAPS: 37.5000 mg | ORAL_CAPSULE | ORAL | Status: DC

## 2012-02-13 NOTE — Progress Notes (Signed)
  Subjective:    Patient ID: Deborah Carrillo, female    DOB: 1981-02-23, 31 y.o.   MRN: [id number] Weight and BP check. Needs refill on phentermine. Pt states still hungry when takes the med- tries to eat fruit and healthy snacks. Goes to gym 6 days a week HPI    Review of Systems     Objective:   Physical Exam        Assessment & Plan:  Abnormal weight gain-she is on fantastic and has lost 7 pounds. I will refill her phentermine. She is to followup in one month for repeat blood pressure weight check. Continue to have lots of healthy snacks available so that she can eat when she is hungry. Cipriano Bunker MD.

## 2012-02-17 ENCOUNTER — Other Ambulatory Visit: Payer: Self-pay | Admitting: Family Medicine

## 2012-08-20 ENCOUNTER — Encounter: Payer: BC Managed Care – PPO | Admitting: Family Medicine

## 2012-08-22 ENCOUNTER — Telehealth: Payer: Self-pay | Admitting: Physician Assistant

## 2012-08-22 ENCOUNTER — Ambulatory Visit (INDEPENDENT_AMBULATORY_CARE_PROVIDER_SITE_OTHER): Payer: BC Managed Care – PPO | Admitting: Physician Assistant

## 2012-08-22 ENCOUNTER — Encounter: Payer: Self-pay | Admitting: Physician Assistant

## 2012-08-22 VITALS — BP 152/90 | HR 86 | Ht 62.0 in | Wt 329.0 lb

## 2012-08-22 DIAGNOSIS — R7309 Other abnormal glucose: Secondary | ICD-10-CM

## 2012-08-22 DIAGNOSIS — Z309 Encounter for contraceptive management, unspecified: Secondary | ICD-10-CM

## 2012-08-22 DIAGNOSIS — Z23 Encounter for immunization: Secondary | ICD-10-CM

## 2012-08-22 DIAGNOSIS — R739 Hyperglycemia, unspecified: Secondary | ICD-10-CM

## 2012-08-22 DIAGNOSIS — IMO0001 Reserved for inherently not codable concepts without codable children: Secondary | ICD-10-CM

## 2012-08-22 DIAGNOSIS — Z111 Encounter for screening for respiratory tuberculosis: Secondary | ICD-10-CM

## 2012-08-22 DIAGNOSIS — Z0000 Encounter for general adult medical examination without abnormal findings: Secondary | ICD-10-CM

## 2012-08-22 DIAGNOSIS — E669 Obesity, unspecified: Secondary | ICD-10-CM

## 2012-08-22 DIAGNOSIS — E8881 Metabolic syndrome: Secondary | ICD-10-CM

## 2012-08-22 DIAGNOSIS — Z79899 Other long term (current) drug therapy: Secondary | ICD-10-CM

## 2012-08-22 LAB — POCT GLYCOSYLATED HEMOGLOBIN (HGB A1C): Hemoglobin A1C: 6.4

## 2012-08-22 LAB — COMPREHENSIVE METABOLIC PANEL
CO2: 27 mEq/L (ref 19–32)
Creat: 0.63 mg/dL (ref 0.50–1.10)
Glucose, Bld: 99 mg/dL (ref 70–99)
Total Bilirubin: 0.4 mg/dL (ref 0.3–1.2)

## 2012-08-22 LAB — HCG, SERUM, QUALITATIVE: Preg, Serum: NEGATIVE

## 2012-08-22 MED ORDER — METFORMIN HCL ER 750 MG PO TB24: ORAL_TABLET | ORAL | Status: DC

## 2012-08-22 MED ORDER — PHENTERMINE-TOPIRAMATE ER 15-92 MG PO CP24: 1.0000 | ORAL_CAPSULE | Freq: Every day | ORAL | Status: DC

## 2012-08-22 MED ORDER — LISINOPRIL-HYDROCHLOROTHIAZIDE 10-12.5 MG PO TABS: 1.0000 | ORAL_TABLET | Freq: Every day | ORAL | Status: DC

## 2012-08-22 MED ORDER — NORGESTIM-ETH ESTRAD TRIPHASIC 0.18/0.215/0.25 MG-35 MCG PO TABS: 1.0000 | ORAL_TABLET | Freq: Every day | ORAL | Status: DC

## 2012-08-22 MED ORDER — PHENTERMINE-TOPIRAMATE ER 7.5-46 MG PO CP24: 1.0000 | ORAL_CAPSULE | Freq: Every day | ORAL | Status: DC

## 2012-08-22 NOTE — Telephone Encounter (Signed)
Please call pt. Let her know that seasonique was not covered by insurance. I sent over ortho tri cyclen instead. Also remind her not to start qsymia(weight loss drug) until blood pressure improves. After couple weeks of being back on Lisinopril should be much better. She can come into office to have checked if she would like.

## 2012-08-22 NOTE — Telephone Encounter (Signed)
Pt informed

## 2012-08-22 NOTE — Progress Notes (Signed)
Subjective:    Patient ID: Deborah Carrillo, female    DOB: 1980-10-16, 31 y.o.   MRN: [id number]  HPI    Review of Systems     Objective:   Physical Exam        Assessment & Plan:   Subjective:     Deborah Carrillo is a 31 y.o. female and is here for a comprehensive physical exam. The patient reports problems - she is really concerned about her weight, she has stopped all of her medications for last 6 months due to money and needs to get back on them..  Her BP is hight today but has not taken BP med in over 6 months. Denies any CP, palpitations, SOB, HA's, dizziness, or numbness and tingling.   Denies any diet or exercise.   Needs PPD and paper work filled out.   History   Social History  . Marital Status: Married    Spouse Name: N/A    Number of Children: N/A  . Years of Education: N/A   Occupational History  . daycare worker    Social History Main Topics  . Smoking status: Current Every Day Smoker -- 0.5 packs/day for 14 years    Types: Cigarettes    Last Attempt to Quit: 03/28/2011  . Smokeless tobacco: Never Used  . Alcohol Use: No  . Drug Use: No  . Sexually Active: Yes -- Female partner(s)   Other Topics Concern  . Not on file   Social History Narrative  . No narrative on file   Health Maintenance  Topic Date Due  . Influenza Vaccine  05/18/2013  . Pap Smear  10/03/2013  . Tetanus/tdap  08/22/2022    The following portions of the patient's history were reviewed and updated as appropriate: allergies, current medications, past family history, past medical history, past social history, past surgical history and problem list.  Review of Systems A comprehensive review of systems was negative.   Objective:    BP 152/90  Pulse 86  Ht 5\' 2"  (1.575 m)  Wt 329 lb (149.233 kg)  BMI 60.17 kg/m2  LMP 06/17/2012 General appearance: alert, cooperative, appears stated age and morbidly obese Head: Normocephalic, without obvious abnormality, atraumatic Eyes:  conjunctivae/corneas clear. PERRL, EOM's intact. Fundi benign. Ears: normal TM's and external ear canals both ears Nose: Nares normal. Septum midline. Mucosa normal. No drainage or sinus tenderness. Throat: lips, mucosa, and tongue normal; teeth and gums normal Neck: no adenopathy, no carotid bruit, no JVD, supple, symmetrical, trachea midline and thyroid not enlarged, symmetric, no tenderness/mass/nodules Back: symmetric, no curvature. ROM normal. No CVA tenderness. Lungs: clear to auscultation bilaterally Heart: regular rate and rhythm, S1, S2 normal, no murmur, click, rub or gallop Abdomen: soft, non-tender; bowel sounds normal; no masses,  no organomegaly Extremities: extremities normal, atraumatic, no cyanosis or edema Pulses: 2+ and symmetric Skin: Skin color, texture, turgor normal. No rashes or lesions Lymph nodes: Cervical, supraclavicular, and axillary nodes normal. Neurologic: Grossly normal    Assessment:    Healthy female exam.      Plan:   Hyptertension- Gave rx for lisinopril/HCTZ for pt to start for BP control. Reminded of low salt diet along with exercise and also decrease BP. Follow up in couple of months to make sure BP is under control. Discussed with patient need for contraception while on this due to birth defects.   Contraception- Serum pregnacy order due to patient not being able to urinate.She needs this due to medication she is on.  Pt wanted seasonique but appears insurance does not cover. Sent cheaper rx to pharmacy to try. Discussed when to start. Pt will start Sunday after period. Discussed missed dose and aware that abx decrease efficacy. Take pills at same time everyday.   PCOS/obesity- NOt on metformin so put back on. She did not like having to remember a pill twice a day so put on ER once a day. Discussed with patient that metformin helps control symptoms of PCOS, help avoid insulin resistance, and help with weight loss. Due to elevated BS did a A1C and was  6.4. Not considered a diabetic but well on her way. Encouraged pt to make diet choices now to help prevent disease. Went ahead and sent over rx for qysmia to try for weight loss. Encouraged her not to start until BP goes down to around 130 or less. I believe she is a great candidate to try rx. My goal for her is to get to a healthy weight to where she might be able to get her to a health place to conceive.  CPE- Filled out paperwork for job. Tday and flu vaccine given today. All other vaccines up to date.  PPD done will come in for read on Monday. LIpid up to date. Never had CMP. Ordered today. Encouraged Calcium intake of 500mg  BID or 4 servings a day. Regular exercise 3-4 times a week. Work on weight loss and taking medication as directed.  Follow up in 2 months on weight loss and HTN.      See After Visit Summary for Counseling Recommendations

## 2012-08-22 NOTE — Patient Instructions (Addendum)
Will send to pharmacy: Lisinopril/HCTZ Sesonique Qysmia Metformin ER  Diet and exercise.

## 2012-09-12 ENCOUNTER — Ambulatory Visit (INDEPENDENT_AMBULATORY_CARE_PROVIDER_SITE_OTHER): Payer: BC Managed Care – PPO | Admitting: Physician Assistant

## 2012-09-12 ENCOUNTER — Telehealth: Payer: Self-pay | Admitting: *Deleted

## 2012-09-12 VITALS — BP 144/90 | HR 95

## 2012-09-12 DIAGNOSIS — Z309 Encounter for contraceptive management, unspecified: Secondary | ICD-10-CM

## 2012-09-12 DIAGNOSIS — I10 Essential (primary) hypertension: Secondary | ICD-10-CM

## 2012-09-12 DIAGNOSIS — E669 Obesity, unspecified: Secondary | ICD-10-CM

## 2012-09-12 LAB — POCT URINE PREGNANCY: Preg Test, Ur: NEGATIVE

## 2012-09-12 MED ORDER — LISINOPRIL-HYDROCHLOROTHIAZIDE 20-25 MG PO TABS: 1.0000 | ORAL_TABLET | Freq: Every day | ORAL | Status: DC

## 2012-09-12 NOTE — Telephone Encounter (Signed)
Can start her OCPs

## 2012-09-12 NOTE — Progress Notes (Addendum)
  Subjective:    Patient ID: Deborah Carrillo, female    DOB: 02-05-1981, 31 y.o.   MRN: [id number]  HPI   Here for BP check and urine pregnancy to determine whether or not she can start Qsymia. Review of Systems     Objective:   Physical Exam        Assessment & Plan:  HTN- blood pressure does look better on the new medication but still not at goal. I will increase her dose to 20/25.  If she still has some of her current pills she can take 2 and then fill the new prescription. She can followup in 2-3 weeks for repeat blood pressure check. If at goal at that point in time then we can start the Qsymia as long as we repeat the pregnancy test or she is actively having her period Nani Gasser, MD  Pt states she came in for BP and pregnancy test so she can start OCPs. She has not had period in a cpl months but wanted to start period before starting OCPs. Pt states she was told at last OV, a med could be given to start period. Please advise.

## 2012-09-12 NOTE — Telephone Encounter (Signed)
Pt states she came in for BP and pregnancy test so she can start OCPs. She has not had period in a cpl months but wanted to start period before starting OCPs. Pt states she was told at last OV, a med could be given to start period. Please advise.

## 2012-09-16 NOTE — Telephone Encounter (Signed)
Pt aware.

## 2012-09-18 ENCOUNTER — Telehealth: Payer: Self-pay | Admitting: *Deleted

## 2012-09-18 NOTE — Telephone Encounter (Signed)
Pt has already been notified of this

## 2012-09-24 ENCOUNTER — Encounter: Payer: Self-pay | Admitting: Family Medicine

## 2012-09-24 ENCOUNTER — Ambulatory Visit (INDEPENDENT_AMBULATORY_CARE_PROVIDER_SITE_OTHER): Payer: BC Managed Care – PPO | Admitting: Family Medicine

## 2012-09-24 VITALS — BP 124/80 | HR 88 | Temp 98.3°F | Ht 62.0 in | Wt 322.0 lb

## 2012-09-24 DIAGNOSIS — T783XXA Angioneurotic edema, initial encounter: Secondary | ICD-10-CM

## 2012-09-24 DIAGNOSIS — I10 Essential (primary) hypertension: Secondary | ICD-10-CM

## 2012-09-24 HISTORY — DX: Essential (primary) hypertension: I10

## 2012-09-24 MED ORDER — METOPROLOL SUCCINATE ER 100 MG PO TB24: 100.0000 mg | ORAL_TABLET | Freq: Every day | ORAL | Status: DC

## 2012-09-24 MED ORDER — METHYLPREDNISOLONE ACETATE 80 MG/ML IJ SUSP
120.0000 mg | Freq: Once | INTRAMUSCULAR | Status: AC
  Administered 2012-09-24: 120 mg via INTRAMUSCULAR

## 2012-09-24 NOTE — Progress Notes (Signed)
CC: Deborah Carrillo is a 32 y.o. female is here for Angioedema   Subjective: HPI:  Patient reports swelling of the face which started yesterday morning. Initially involved the left cheek, migrate to the lower lip late last night. Initially symptoms were described as mild, now moderate in severity prompting evaluation with [place]. Discomfort located on the lower right lip at point of maximal swelling described as pressure. Nothing makes discomfort better or worse. No interventions as of yet. Symptoms can present 24 hours a day, however worsening. She denies any change to personal care products, diet, but did increase the lisinopril/hydrochlorothiazide regimen approximately a week. She's never had this before. She denies trauma to the site of discomfort. She denies dental pain nor trauma. No other changes to medication or over-the-counter nor supplement regimen.  Denies change in salt intake. Denies fevers, chills, trouble swallowing, shortness of breath, wheezing, tongue swelling, neck swelling, ear discomfort, respiratory complaints, nasal congestion, chest pain, shortness of breath, rapid heartbeat, palpitations, flushing sensation, GI disturbance, nor swelling elsewhere. Denies recent fish or shellfish ingestion, no known food allergies   Review Of Systems Outlined In HPI  Past Medical History  Diagnosis Date  . Polycystic ovary disease   . Essential hypertension 09/24/2012     Family History  Problem Relation Age of Onset  . Diabetes Maternal Grandmother   . Hypothyroidism Mother      History  Substance Use Topics  . Smoking status: Current Every Day Smoker -- 0.5 packs/day for 14 years    Types: Cigarettes    Last Attempt to Quit: 03/28/2011  . Smokeless tobacco: Never Used  . Alcohol Use: No     Objective: Filed Vitals:   09/24/12 0933  BP: 124/80  Pulse: 88  Temp: 98.3 F (36.8 C)    General: Alert and Oriented, No Acute Distress HEENT: Pupils equal, round, reactive to light.  Conjunctivae clear.  External ears unremarkable, canals clear with intact TMs with appropriate landmarks.  Middle ear appears open without effusion. Pink inferior turbinates.  Moist mucous membranes, pharynx without inflammation nor lesions.  Neck supple without palpable lymphadenopathy nor abnormal masses. Parotid gland massage nontender nor enlarged bilaterally. There is moderate lower lip swelling more so on the right than left which is nontender, smooth, and without lesions. Tongue is normal size. Lungs: Clear to auscultation bilaterally, no wheezing/ronchi/rales.  Comfortable work of breathing. Good air movement. Cardiac: Regular rate and rhythm. Normal S1/S2.  No murmurs, rubs, nor gallops.   Extremities: No peripheral edema.  Strong peripheral pulses.  Mental Status: No depression, anxiety, nor agitation. Skin: Warm and dry.  Assessment & Plan: Deborah Carrillo was seen today for angioedema.  Diagnoses and associated orders for this visit:  Angioedema of lips - methylPREDNISolone acetate (DEPO-MEDROL) injection 120 mg; Inject 1.5 mLs (120 mg total) into the muscle once.  Essential hypertension - metoprolol succinate (TOPROL XL) 100 MG 24 hr tablet; Take 1 tablet (100 mg total) by mouth daily. Take with or immediately following a meal.    Angioedema: Concerned that this may be secondary to lisinopril, she did not take a dose today and was advised to no longer take this class of medications, expresses understanding. We'll switch her to a moderate dose of metoprolol and have her return on Monday for blood pressure check. Return if not improving sooner.Signs and symptoms requring emergent/urgent reevaluation were discussed with the patient.  Return in about 5 days (around 09/29/2012) for BP Check.

## 2012-09-24 NOTE — Patient Instructions (Addendum)
Angioedema Angioedema (AE) is a sudden swelling of the eyelids, lips, lobes of ears, external genitalia, skin, and other parts of the body. AE can happen by itself. It usually begins during the night and is found on awakening. It can happen with hives and other allergic reactions. Attacks can be mild and annoying, or life-threatening if the air passages swell. AE generally occurs in a short time period (over minutes to hours) and gets better in 24 to 48 hours. It usually does not cause any serious problems.  There are 2 different kinds of AE:   Allergic AE.  Nonallergic AE.  There may be an overreaction or direct stimulation of cells that are a part of the immune system (mast cells).  There may be problems with the release of chemicals made by the body that cause swelling and inflammation (kinins). AE due to kinins can be inherited from parents (hereditary), or it can develop on its own (acquired). Acquired AE either shows up before, or along with, certain diseases or is due to the body's immune system attacking parts of the body's own cells (autoimmune). CAUSES  Allergic  AE due to allergic reactions are caused by something that causes the body to react (trigger). Common triggers include:  Foods.  Medicines.  Latex.  Direct contact with certain fruits, vegetables, or animal saliva.  Insect stings. Nonallergic  Mast cell stimulation may be caused by:  Medicines.  Dyes used in X-rays.  The body's own immune system reactions to parts of the body (autoimmune disease).  Possibly, some virus infections.  AE due to problems with kinins can be hereditary or acquired. Attacks are triggered by:  Mild injury.  Dental work or any surgery.  Stress.  Sudden changes in temperature.  Exercise.  Medicines.  AE due to problems with kinins can also be due to certain medicines, especially blood pressure medicines like angiotensin-converting enzyme (ACE) inhibitors. African Americans  are at nearly 5 times greater risk of developing AE than Caucasians from ACE inhibitors. SYMPTOMS  Allergic symptoms:  Non-itchy swelling of the skin. Often the swelling is on the face and lips, but any area of the skin can swell. Sometimes, the swelling can be painful. If hives are present, there is intense itching.  Breathing problems if the air passages swell. Nonallergic symptoms:  If internal organs are involved, there may be:  Nausea.  Abdominal pain.  Vomiting.  Difficulty swallowing.  Difficulty passing urine.  Breathing problems if the air passages swell. Depending on the cause of AE, episodes may:  Only happen once (if triggers are removed or avoided).  Come back in unpredictable patterns.  Repeat for several years and then gradually fade away. DIAGNOSIS  AE is diagnosed by:   Asking questions to find out how fast the symptoms began.  Taking a family history.  Physical exam.  Diagnostic tests. Tests could include:  Allergy skin tests to see if the problem is allergic.  Blood tests to diagnose hereditary and some acquired types of AE.  Other tests to see if there is a hidden disease leading to the AE. TREATMENT  Treatment depends on the type and cause (if any) of the AE. Allergic  Allergic types of AE are treated with:  Immediate removal of the trigger or medicine (if any).  Epinephrine injection.  Steroids.  Antihistamines.  Hospitalization for severe attacks. Nonallergic  Mast cell stimulation types of AE are treated with:  Immediate removal of the trigger or medicine (if any).  Epinephrine injection.    Allergic   Allergic types of AE are treated with:   Immediate removal of the trigger or medicine (if any).   Epinephrine injection.   Steroids.   Antihistamines.   Hospitalization for severe attacks.  Nonallergic   Mast cell stimulation types of AE are treated with:   Immediate removal of the trigger or medicine (if any).   Epinephrine injection.   Steroids.   Antihistamines.   Hospitalization for severe attacks.   Hereditary AE is treated with:   Medicines to prevent and treat attacks. There is little response to antihistamines, epinephrine, or steroids.   Preventive medicines before dental work or surgery.   Removing or avoiding medicines that trigger  attacks.   Hospitalization for severe attacks.   Acquired AE is treated with:   Treating underlying disease (if any).   Medicines to prevent and treat attacks.  HOME CARE INSTRUCTIONS    Always carry your emergency allergy treatment medicines with you.   Wear a medical bracelet.   Avoid known triggers.  SEEK MEDICAL CARE IF:    You get repeat attacks.   Your attacks are more frequent or more severe despite preventive measures.   You have hereditary AE and are considering having children. It is important to discuss the risks of passing this on to your children.  SEEK IMMEDIATE MEDICAL CARE IF:    You have difficulty breathing.   You have difficulty swallowing.   You experience fainting.  This condition should be treated immediately. It can be life-threatening if it involves throat swelling.  Document Released: 11/12/2001 Document Revised: 11/26/2011 Document Reviewed: 09/02/2008  [employer] Patient Information 2013 [employer].

## 2012-10-24 ENCOUNTER — Ambulatory Visit: Payer: BC Managed Care – PPO | Admitting: Physician Assistant

## 2013-05-15 ENCOUNTER — Encounter: Payer: Self-pay | Admitting: Family Medicine

## 2013-05-15 ENCOUNTER — Ambulatory Visit (INDEPENDENT_AMBULATORY_CARE_PROVIDER_SITE_OTHER): Payer: BC Managed Care – PPO | Admitting: Family Medicine

## 2013-05-15 VITALS — BP 172/116 | HR 115 | Wt 337.0 lb

## 2013-05-15 DIAGNOSIS — S335XXA Sprain of ligaments of lumbar spine, initial encounter: Secondary | ICD-10-CM

## 2013-05-15 DIAGNOSIS — S39012A Strain of muscle, fascia and tendon of lower back, initial encounter: Secondary | ICD-10-CM

## 2013-05-15 DIAGNOSIS — I10 Essential (primary) hypertension: Secondary | ICD-10-CM

## 2013-05-15 MED ORDER — MELOXICAM 15 MG PO TABS: 15.0000 mg | ORAL_TABLET | Freq: Every day | ORAL | Status: DC

## 2013-05-15 MED ORDER — CYCLOBENZAPRINE HCL 10 MG PO TABS: ORAL_TABLET | ORAL | Status: DC

## 2013-05-15 NOTE — Progress Notes (Signed)
CC: Deborah Carrillo is a 32 y.o. female is here for Back Pain   Subjective: HPI:  Patient complains of left lower back pain described as tightness and spasm moderate severity worse with turning movements of the torso or sitting for long periods of time. Improves with walking and lying down. Worse with direct palpation of just lateral to the lumbar spine. Denies known over exertion or recent trauma. Pain is nonradiating without midline back pain nor saddle paresthesia nor bowel or bladder incontinence. She denies motor or sensory disturbances or rashes at the site of discomfort. Slightly improved with ibuprofen nothing else makes better or worse fluctuates throughout the day sudden onset 2 weeks ago.  Patient reports she is taking metoprolol 100 mg daily however about a month ago she noticed she was feeling fatigued and dizzy after taking his medication. Denies chest pain shortness of breath orthopnea nor motor or sensory disturbances   Review Of Systems Outlined In HPI  Past Medical History  Diagnosis Date  . Polycystic ovary disease   . Essential hypertension 09/24/2012     Family History  Problem Relation Age of Onset  . Diabetes Maternal Grandmother   . Hypothyroidism Mother      History  Substance Use Topics  . Smoking status: Current Every Day Smoker -- 0.50 packs/day for 14 years    Types: Cigarettes    Last Attempt to Quit: 03/28/2011  . Smokeless tobacco: Never Used  . Alcohol Use: No     Objective: Filed Vitals:   05/15/13 1629  BP: 172/116  Pulse: 115    General: Alert and Oriented, No Acute Distress HEENT: Pupils equal, round, reactive to light. Conjunctivae clear.  Moist mucous membranes pharynx unremarkable Lungs: Clear to auscultation bilaterally, no wheezing/ronchi/rales.  Comfortable work of breathing. Good air movement. Cardiac: Regular rate and rhythm. Normal S1/S2.  No murmurs, rubs, nor gallops.   Abdomen: Obese soft Extremities: No peripheral edema.   Strong peripheral pulses. Full range of motion strength of both lower extremities with L4 and S1 DTRs two over four bilaterally. Negative straight leg raise negative FABER/FADIR. Back: No midline lumbar tenderness over spinous process, no SI joint pain with palpation, pain is reproduced with palpation of paraspinal musculature in the lumbar region near L2 where there is moderate hypertonicity Mental Status: No depression, anxiety, nor agitation. Skin: Warm and dry.  Assessment & Plan: Deborah Carrillo was seen today for back pain.  Diagnoses and associated orders for this visit:  Essential hypertension  Lumbar strain, initial encounter - meloxicam (MOBIC) 15 MG tablet; Take 1 tablet (15 mg total) by mouth daily. - cyclobenzaprine (FLEXERIL) 10 MG tablet; Take a half to a full tab every 8-12 hours only as needed for muscle spasm, may cause sedation.    Essential hypertension: Uncontrolled, we discussed increasing the Toprol today however she would prefer to wait until pain is improved citing a suspicion of hypotension for which she was experiencing dizziness and fatigue, she will return in 1-2 weeks for recheck Lumbar strain: Discussed starting meloxicam and cyclobenzaprine encouraged her to stay active as tolerated expect resolution within one to 2 weeks  Return in about 2 weeks (around 05/29/2013).

## 2013-05-29 ENCOUNTER — Ambulatory Visit (INDEPENDENT_AMBULATORY_CARE_PROVIDER_SITE_OTHER): Payer: BC Managed Care – PPO | Admitting: Family Medicine

## 2013-05-29 ENCOUNTER — Encounter: Payer: Self-pay | Admitting: Family Medicine

## 2013-05-29 VITALS — BP 161/90 | HR 86 | Wt 337.0 lb

## 2013-05-29 DIAGNOSIS — Z23 Encounter for immunization: Secondary | ICD-10-CM

## 2013-05-29 DIAGNOSIS — I10 Essential (primary) hypertension: Secondary | ICD-10-CM

## 2013-05-29 DIAGNOSIS — I872 Venous insufficiency (chronic) (peripheral): Secondary | ICD-10-CM

## 2013-05-29 NOTE — Patient Instructions (Addendum)
Go to a pharmacy to check your blood pressure, if above 140/90 call me and we'll increase your metoprolol, if below 140/90 stay on your current dose.

## 2013-05-31 NOTE — Progress Notes (Signed)
CC: Deborah Carrillo is a 32 y.o. female is here for f/u BP   Subjective: HPI:  Followup essential hypertension: Reports taking metoprolol 100 mg daily without missed doses other than this morning. No outside blood pressures to report. She denies known side effects. Nothing particularly makes blood pressure worse, she states it improves greatly on days she does not forget to take metoprolol. Denies headache, motor sensory disturbances, chest pain, shortness of breath, irregular heartbeat, orthopnea  Patient complains of left ankle swelling has been present for one to 2 months. Presently daily basis mild in severity. Absent in the morning present only in the late hours of the day if she's been on her feet the majority of the day. She reports she has varicose veins but has never been told she has a DVT or PE. She denies skin discoloration or warmth of the extremity when swelling is present or absent.  Denies shortness of breath, chest pain.   Review Of Systems Outlined In HPI  Past Medical History  Diagnosis Date  . Polycystic ovary disease   . Essential hypertension 09/24/2012     Family History  Problem Relation Age of Onset  . Diabetes Maternal Grandmother   . Hypothyroidism Mother      History  Substance Use Topics  . Smoking status: Current Every Day Smoker -- 0.50 packs/day for 14 years    Types: Cigarettes    Last Attempt to Quit: 03/28/2011  . Smokeless tobacco: Never Used  . Alcohol Use: No     Objective: Filed Vitals:   05/29/13 1621  BP: 161/90  Pulse: 86    General: Alert and Oriented, No Acute Distress HEENT: Pupils equal, round, reactive to light. Conjunctivae clear.  Moist mucous membranes pharynx unremarkable Lungs: Clear to auscultation bilaterally, no wheezing/ronchi/rales.  Comfortable work of breathing. Good air movement. Cardiac: Regular rate and rhythm. Normal S1/S2.  No murmurs, rubs, nor gallops.   Abdomen: Obese soft Extremities: No peripheral edema.   Strong peripheral pulses.  The left lower extremity has mild scattered non-tender varicose veins, no pain with palpation of the calf no palpable cord in the calf Mental Status: No depression, anxiety, nor agitation. Skin: Warm and dry. No skin abnormalities other than above in the left lower extremity  Assessment & Plan: Deborah Carrillo was seen today for f/u bp.  Diagnoses and associated orders for this visit:  Need for prophylactic vaccination and inoculation against influenza  Essential hypertension  Chronic venous insufficiency    Essential hypertension: Uncontrolled chronic condition, I encouraged her to take her blood pressure at a local pharmacy most days of the week. If blood pressures remain above 140/90 return for medication adjustments Chronic venous insufficiency: Discussed diet interventions to help lower her sodium intake which should help with her swelling, very low suspicion of renal insufficiency or heart failure She will receive flu shot today  Return in about 4 weeks (around 06/28/2013).

## 2013-07-28 ENCOUNTER — Other Ambulatory Visit: Payer: Self-pay | Admitting: *Deleted

## 2013-07-28 MED ORDER — NORGESTIM-ETH ESTRAD TRIPHASIC 0.18/0.215/0.25 MG-35 MCG PO TABS: 1.0000 | ORAL_TABLET | Freq: Every day | ORAL | Status: DC

## 2013-12-06 ENCOUNTER — Other Ambulatory Visit: Payer: Self-pay | Admitting: Family Medicine

## 2014-01-08 ENCOUNTER — Telehealth: Payer: Self-pay | Admitting: Family Medicine

## 2014-01-08 MED ORDER — METOPROLOL SUCCINATE ER 100 MG PO TB24: ORAL_TABLET | ORAL | Status: DC

## 2014-01-08 NOTE — Telephone Encounter (Signed)
Pt called.  She made an appt for 4/30 and wants to know if she can get refill on bp meds until appt. Thank you.

## 2014-01-08 NOTE — Telephone Encounter (Signed)
Called and informed pt .Deborah Carrillo

## 2014-01-14 ENCOUNTER — Telehealth: Payer: Self-pay | Admitting: Family Medicine

## 2014-01-14 ENCOUNTER — Encounter: Payer: Self-pay | Admitting: Family Medicine

## 2014-01-14 ENCOUNTER — Ambulatory Visit (INDEPENDENT_AMBULATORY_CARE_PROVIDER_SITE_OTHER): Payer: BC Managed Care – PPO | Admitting: Family Medicine

## 2014-01-14 VITALS — BP 138/84 | HR 86 | Wt 339.0 lb

## 2014-01-14 DIAGNOSIS — R300 Dysuria: Secondary | ICD-10-CM

## 2014-01-14 DIAGNOSIS — F17200 Nicotine dependence, unspecified, uncomplicated: Secondary | ICD-10-CM

## 2014-01-14 DIAGNOSIS — E282 Polycystic ovarian syndrome: Secondary | ICD-10-CM

## 2014-01-14 DIAGNOSIS — E119 Type 2 diabetes mellitus without complications: Secondary | ICD-10-CM

## 2014-01-14 DIAGNOSIS — Z720 Tobacco use: Secondary | ICD-10-CM

## 2014-01-14 DIAGNOSIS — I10 Essential (primary) hypertension: Secondary | ICD-10-CM

## 2014-01-14 DIAGNOSIS — E669 Obesity, unspecified: Secondary | ICD-10-CM

## 2014-01-14 MED ORDER — PHENTERMINE-TOPIRAMATE ER 3.75-23 MG PO CP24: 1.0000 | ORAL_CAPSULE | Freq: Every morning | ORAL | Status: DC

## 2014-01-14 MED ORDER — PHENTERMINE-TOPIRAMATE ER 7.5-46 MG PO CP24: 1.0000 | ORAL_CAPSULE | Freq: Every morning | ORAL | Status: DC

## 2014-01-14 MED ORDER — METFORMIN HCL 500 MG PO TABS: 500.0000 mg | ORAL_TABLET | Freq: Two times a day (BID) | ORAL | Status: DC

## 2014-01-14 NOTE — Telephone Encounter (Signed)
Please call patient and let her know that were going to start metformin. This will help control her sugars in addition to helping her lose weight. Continue take until I see her back in one month.

## 2014-01-14 NOTE — Telephone Encounter (Signed)
lvm informing pt of medication recommendation.Deborah Carrillo

## 2014-01-14 NOTE — Progress Notes (Signed)
   Subjective:    Patient ID: Deborah Carrillo, female    DOB: 27-Apr-1981, 33 y.o.   MRN: [id number]  HPI Hypertension- Pt denies chest pain, SOB, dizziness, or heart palpitations.  Taking meds as directed w/o problems.  Denies medication side effects.    IFG - has had sone increased thirst.  Having some urgency.  In urinary urgency. Has hd some accidents .   Obesity - She is interested in trying the Qsymia.  Has been waling during her lunch break.  Review of Systems     Objective:   Physical Exam  Constitutional: She is oriented to person, place, and time. She appears well-developed and well-nourished.  HENT:  Head: Normocephalic and atraumatic.  Cardiovascular: Normal rate, regular rhythm and normal heart sounds.   Pulmonary/Chest: Effort normal and breath sounds normal.  Neurological: She is alert and oriented to person, place, and time.  Skin: Skin is warm and dry.  Psychiatric: She has a normal mood and affect. Her behavior is normal.          Assessment & Plan:  HTN- Well controlled. Much better than last time. Due for CMP and fasting lipid panel. Followup in 6 months.  Diabetes-new diagnosis. Hemoglobin A1c is 6.9 today. She previously was impaired fasting glucose but is now fully diabetic. I would like to add metformin to her regimen and really want to focus on lifestyle changes and weight loss. I think if we can get her weight down and get this back under control we can move her back and impaired fasting glucose range.   Obesity/BMI 62 - She would like start the Qsymia.  BP is under control.  Will work on skipping meals and avoid snacking.  Work on eating breakfast.  Recommend Smart phone application call my [employer]. Encouraged her to use this to help track her calories. Also make sure getting some type of exercise daily.  Tob abuse - we discussed different options including nicotine placement. She's to quit cold [place] in the past and was unable to do so. I hesitate on  using a prescription medication at this point since she plans on starting the Qsymia. I think the nicotine replacement would be a reasonable option. We discussed the patches, lozenges, gum. Recommend starting a weaning down. She currently smokes a half a pack a day.

## 2014-02-11 ENCOUNTER — Other Ambulatory Visit: Payer: Self-pay | Admitting: Family Medicine

## 2014-02-11 ENCOUNTER — Ambulatory Visit: Payer: BC Managed Care – PPO | Admitting: Family Medicine

## 2014-02-16 ENCOUNTER — Ambulatory Visit: Payer: BC Managed Care – PPO | Admitting: Family Medicine

## 2014-03-13 ENCOUNTER — Other Ambulatory Visit: Payer: Self-pay | Admitting: Family Medicine

## 2014-06-30 ENCOUNTER — Other Ambulatory Visit: Payer: Self-pay | Admitting: Physician Assistant

## 2014-07-14 ENCOUNTER — Other Ambulatory Visit: Payer: Self-pay | Admitting: Family Medicine

## 2014-07-19 ENCOUNTER — Encounter: Payer: Self-pay | Admitting: Family Medicine

## 2014-07-29 ENCOUNTER — Other Ambulatory Visit: Payer: Self-pay | Admitting: Family Medicine

## 2014-08-06 ENCOUNTER — Ambulatory Visit: Payer: BC Managed Care – PPO | Admitting: Physician Assistant

## 2014-08-11 ENCOUNTER — Other Ambulatory Visit: Payer: Self-pay | Admitting: Family Medicine

## 2014-08-16 ENCOUNTER — Ambulatory Visit (INDEPENDENT_AMBULATORY_CARE_PROVIDER_SITE_OTHER): Payer: BC Managed Care – PPO | Admitting: Family Medicine

## 2014-08-16 ENCOUNTER — Encounter: Payer: Self-pay | Admitting: Family Medicine

## 2014-08-16 VITALS — BP 180/117 | HR 97 | Wt 350.0 lb

## 2014-08-16 DIAGNOSIS — M722 Plantar fascial fibromatosis: Secondary | ICD-10-CM

## 2014-08-16 DIAGNOSIS — I10 Essential (primary) hypertension: Secondary | ICD-10-CM | POA: Diagnosis not present

## 2014-08-16 MED ORDER — MELOXICAM 15 MG PO TABS: 15.0000 mg | ORAL_TABLET | Freq: Every day | ORAL | Status: DC

## 2014-08-16 MED ORDER — METOPROLOL SUCCINATE ER 200 MG PO TB24: ORAL_TABLET | ORAL | Status: DC

## 2014-08-16 MED ORDER — HYDROCODONE-ACETAMINOPHEN 10-325 MG PO TABS: 1.0000 | ORAL_TABLET | Freq: Every evening | ORAL | Status: DC | PRN

## 2014-08-16 NOTE — Progress Notes (Signed)
CC: Deborah Carrillo is a 33 y.o. female is here for right heel pain   Subjective: HPI:  Left heel pain localized to the plantar surface of the heel that is nonradiating, burning, worse with standing. Symptoms are moderate to severe with standing and still bad enough to disrupt her sleep when lying down. Symptoms have been present for 2 weeks worsening on a daily basis. She gets relief from ibuprofen however only for about 2 or 3 hours. She denies any other joint pain. She denies any accident trauma or overexertion. Symptoms are improved with wearing sneakers and worse with stress shoes. Denies any overlying skin changes aside of discomfort. She's never had this before.  She tells me that her blood pressure today is very similar to numbers that she's been noticing at home. She is still taking metoprolol 100 mg on a daily basis without any missed doses. She denies chest pain shortness of breath orthopnea nor peripheral edema   Review Of Systems Outlined In HPI  Past Medical History  Diagnosis Date  . Polycystic ovary disease   . Essential hypertension 09/24/2012    Past Surgical History  Procedure Laterality Date  . Cesarean section  2001  . Tympanoplasty  1988   Family History  Problem Relation Age of Onset  . Diabetes Maternal Grandmother   . Hypothyroidism Mother     History   Social History  . Marital Status: Married    Spouse Name: N/A    Number of Children: N/A  . Years of Education: N/A   Occupational History  . daycare worker    Social History Main Topics  . Smoking status: Current Every Day Smoker -- 0.50 packs/day for 14 years    Types: Cigarettes    Last Attempt to Quit: 03/28/2011  . Smokeless tobacco: Never Used  . Alcohol Use: No  . Drug Use: No  . Sexual Activity:    Partners: Male   Other Topics Concern  . Not on file   Social History Narrative     Objective: BP 180/117 mmHg  Pulse 97  Wt 350 lb (158.759 kg)  General: Alert and Oriented, No Acute  Distress HEENT: Pupils equal, round, reactive to light. Conjunctivae clear.   Lungs: Clear comfortable work of breathing Cardiac: Regular rate and rhythm.  Extremities: No peripheral edema.  Strong peripheral pulses.  Exam of the left foot reveals pain at the plantar calcaneus when palpated, no pain at the base of the fifth metatarsal, no pain at the navicular, no pain at the medial or lateral malleoli. No overlying skin changes at the site of discomfort on the plantar aspect of the foot. Pain is reproduced with passive dorsiflexion of the foot. Mental Status: No depression, anxiety, nor agitation. Skin: Warm and dry.  Assessment & Plan: Deborah Carrillo was seen today for right heel pain.  Diagnoses and associated orders for this visit:  Essential hypertension - metoprolol succinate (TOPROL-XL) 200 MG 24 hr tablet; TAKE 1 TABLET BY MOUTH EVERY DAY IMMEDIATELY FOLLOWING A MEAL. OFFICE VISIT NEEDED FOR REFILLS.  Plantar fasciitis of left foot - meloxicam (MOBIC) 15 MG tablet; Take 1 tablet (15 mg total) by mouth daily. To prevent foot inflammation. - HYDROcodone-acetaminophen (NORCO) 10-325 MG per tablet; Take 1 tablet by mouth at bedtime as needed.    Essential hypertension: Chronic uncontrolled condition, increasing metoprolol Plantar fasciitis of the left foot: Begin Meloxicam daily and hydrocodone only as needed to help with sleep. She was given a handout of rehabilitative stretches and  exercises to be done on a daily basis for the next 3 weeks. She was given a heel cup today however did not provide any comfort so she politely declined to take home.   Return for 1-3 months for blood pressure check.

## 2014-08-24 ENCOUNTER — Other Ambulatory Visit: Payer: Self-pay | Admitting: Family Medicine

## 2014-09-08 ENCOUNTER — Other Ambulatory Visit: Payer: Self-pay | Admitting: Family Medicine

## 2014-09-13 ENCOUNTER — Other Ambulatory Visit: Payer: Self-pay | Admitting: Family Medicine

## 2014-10-13 ENCOUNTER — Other Ambulatory Visit: Payer: Self-pay | Admitting: Family Medicine

## 2014-10-15 ENCOUNTER — Ambulatory Visit: Payer: BC Managed Care – PPO | Admitting: Family Medicine

## 2014-10-21 ENCOUNTER — Telehealth: Payer: Self-pay | Admitting: Family Medicine

## 2014-10-21 ENCOUNTER — Telehealth: Payer: Self-pay | Admitting: Emergency Medicine

## 2014-10-21 NOTE — Telephone Encounter (Signed)
I agree needs appt for her BP.  It was crazy high and we need to make sure this has come back down

## 2014-10-21 NOTE — Telephone Encounter (Signed)
Patient called to schedule an appt to have her birth control refilled and she has to work and Dr. Linford ArnoldMetheney schedule di not have any times available on the certain days she could come. She set up an appointment for 11/04/14 at 8:15 but she only has two pills left and states she needs a refill sent to her pharmacy. Thanks

## 2014-10-21 NOTE — Telephone Encounter (Signed)
Patient called because her OCP rx was denied a refill and she only has 2 pills remaining. Review of record shows BP 180/117 on 08/17/15; was told to RTC in 1-3 months for re-evaluation. Left message for her to call for appt; decision would be made on refill to allow for timing of appt.

## 2014-10-23 MED ORDER — NORGESTIM-ETH ESTRAD TRIPHASIC 0.18/0.215/0.25 MG-35 MCG PO TABS: 1.0000 | ORAL_TABLET | Freq: Every day | ORAL | Status: DC

## 2014-10-23 NOTE — Telephone Encounter (Signed)
See other note

## 2014-10-23 NOTE — Telephone Encounter (Signed)
Sent in Rx

## 2014-11-04 ENCOUNTER — Ambulatory Visit: Payer: Self-pay | Admitting: Family Medicine

## 2014-11-13 ENCOUNTER — Other Ambulatory Visit: Payer: Self-pay | Admitting: Family Medicine

## 2014-11-14 ENCOUNTER — Emergency Department
Admission: EM | Admit: 2014-11-14 | Discharge: 2014-11-14 | Disposition: A | Discharge status: Home / Self Care | Payer: BLUE CROSS/BLUE SHIELD | Source: Home / Self Care | Attending: Family Medicine | Admitting: Family Medicine

## 2014-11-14 DIAGNOSIS — J069 Acute upper respiratory infection, unspecified: Secondary | ICD-10-CM

## 2014-11-14 DIAGNOSIS — J9801 Acute bronchospasm: Secondary | ICD-10-CM | POA: Diagnosis not present

## 2014-11-14 DIAGNOSIS — B9789 Other viral agents as the cause of diseases classified elsewhere: Principal | ICD-10-CM

## 2014-11-14 MED ORDER — DOXYCYCLINE HYCLATE 100 MG PO CAPS: 100.0000 mg | ORAL_CAPSULE | Freq: Two times a day (BID) | ORAL | Status: DC

## 2014-11-14 MED ORDER — BENZONATATE 200 MG PO CAPS: 200.0000 mg | ORAL_CAPSULE | Freq: Every day | ORAL | Status: DC

## 2014-11-14 MED ORDER — PREDNISONE 20 MG PO TABS: 20.0000 mg | ORAL_TABLET | Freq: Two times a day (BID) | ORAL | Status: DC

## 2014-11-14 NOTE — ED Notes (Signed)
Patient c/o cough and chest congestion x 1 day, has taken ibuprofen, sudafed , dayquil and nasacort nasal spray.

## 2014-11-14 NOTE — ED Provider Notes (Signed)
CSN: [id number]     Arrival date & time 11/14/14  1641 History   First MD Initiated Contact with Patient 11/14/14 1727     Chief Complaint  Patient presents with  . Cough      HPI Comments: Patient complains of one day history of typical cold-like symptoms including mild sore throat, sinus congestion, headache, fatigue, and cough.   The history is provided by the patient.    Past Medical History  Diagnosis Date  . Polycystic ovary disease   . Essential hypertension 09/24/2012   Past Surgical History  Procedure Laterality Date  . Cesarean section  2001  . Tympanoplasty  1988   Family History  Problem Relation Age of Onset  . Diabetes Maternal Grandmother   . Hypothyroidism Mother    History  Substance Use Topics  . Smoking status: Current Every Day Smoker -- 0.50 packs/day for 14 years    Types: Cigarettes    Last Attempt to Quit: 03/28/2011  . Smokeless tobacco: Never Used  . Alcohol Use: No   OB History    Gravida Para Term Preterm AB TAB SAB Ectopic Multiple Living   2 1 1       1      Review of Systems + sore throat + cough + sneezing No pleuritic pain + wheezing + nasal congestion + post-nasal drainage No sinus pain/pressure No itchy/red eyes No earache No hemoptysis + SOB No fever/chills No nausea No vomiting No abdominal pain No diarrhea No urinary symptoms No skin rash + fatigue + myalgias + headache Used OTC meds without relief  Allergies  Amoxicillin; Erythromycin; Lisinopril; and Penicillins  Home Medications   Prior to Admission medications   Medication Sig Start Date End Date Taking? Authorizing Provider  meloxicam (MOBIC) 15 MG tablet TAKE 1 TABLET BY MOUTH EVERY DAY TO PREVENT FOOT INFLAMMATION 09/13/14  Yes Sean Hommel, DO  metFORMIN (GLUCOPHAGE) 500 MG tablet TAKE 1 TABLET (500 MG TOTAL) BY MOUTH 2 (TWO) TIMES DAILY WITH A MEAL. 09/13/14  Yes Agapito Gamesatherine D Metheney, MD  metoprolol (TOPROL-XL) 200 MG 24 hr tablet TAKE 1 TABLET BY  MOUTH EVERY DAY IMMEDIATELY FOLLOWING A MEAL. OFFICE VISIT NEEDED FOR REFILLS. 09/13/14  Yes Sean Hommel, DO  Norgestimate-Ethinyl Estradiol Triphasic (TRI-PREVIFEM) 0.18/0.215/0.25 MG-35 MCG tablet Take 1 tablet by mouth daily. 11/14/14  Yes Agapito Gamesatherine D Metheney, MD  benzonatate (TESSALON) 200 MG capsule Take 1 capsule (200 mg total) by mouth at bedtime. Take as needed for cough 11/14/14   Lattie HawStephen A Beese, MD  doxycycline (VIBRAMYCIN) 100 MG capsule Take 1 capsule (100 mg total) by mouth 2 (two) times daily. Take with food (Rx void after 11/22/14) 11/14/14   Lattie HawStephen A Beese, MD  predniSONE (DELTASONE) 20 MG tablet Take 1 tablet (20 mg total) by mouth 2 (two) times daily. Take with food. 11/14/14   Lattie HawStephen A Beese, MD   BP 167/108 mmHg  Pulse 79  Temp(Src) 98.2 F (36.8 C) (Oral)  Ht 5\' 3"  (1.6 m)  Wt 344 lb (156.037 kg)  BMI 60.95 kg/m2  SpO2 98% Physical Exam Nursing notes and Vital Signs reviewed. Appearance:  Patient appears stated age, and in no acute distress.  Patient is morbidly obese (BMI 61.0) Eyes:  Pupils are equal, round, and reactive to light and accomodation.  Extraocular movement is intact.  Conjunctivae are not inflamed  Ears:  Canals normal.  Tympanic membranes normal.  Nose:  Congested turbinates.  No sinus tenderness.  Pharynx:  Normal Neck:  Supple.  Tender enlarged posterior nodes are palpated bilaterally  Lungs:  Bilateral posterior expiratory wheezes.  Breath sounds are equal.  No respiratory distress. Heart:  Regular rate and rhythm without murmurs, rubs, or gallops.  Abdomen:  Nontender without masses or hepatosplenomegaly.  Bowel sounds are present.  No CVA or flank tenderness.  Extremities:  No edema.  No calf tenderness Skin:  No rash present.   ED Course  Procedures  none     MDM   1. Viral URI with cough   2. Bronchospasm, acute    There is no evidence of bacterial infection today.  Begin prednisone burst.  Prescription written for Benzonatate (Tessalon)  to take at bedtime for night-time cough.  Take plain guaifenesin (  extended release tabs such as Mucinex) twice daily, with plenty of water, for cough and congestion.  Get adequate rest.   May use Afrin nasal spray (or generic oxymetazoline) twice daily for about 5 days.  Also recommend using saline nasal spray several times daily and saline nasal irrigation (AYR is a common brand).  Use Nasacort nasal spray each morning after using Afrin nasal spray and saline nasal irrigation. Try warm salt water gargles for sore throat.  Stop all antihistamines for now, and other non-prescription cough/cold preparations. Begin Doxycycline if not improving about 5 to 7 days or if persistent fever develops (Given a prescription to hold, with an expiration date)  Follow-up with family doctor if not improving about10 days. Follow-up with family doctor for hypertension.     Lattie Haw, MD 11/18/14 [phone]

## 2014-11-14 NOTE — Discharge Instructions (Signed)
Take plain guaifenesin (1200mg  extended release tabs such as Mucinex) twice daily, with plenty of water, for cough and congestion.  Get adequate rest.   May use Afrin nasal spray (or generic oxymetazoline) twice daily for about 5 days.  Also recommend using saline nasal spray several times daily and saline nasal irrigation (AYR is a common brand).  Use Nasacort nasal spray each morning after using Afrin nasal spray and saline nasal irrigation. Try warm salt water gargles for sore throat.  Stop all antihistamines for now, and other non-prescription cough/cold preparations. Begin Doxycycline if not improving about 5 to 7 days or if persistent fever develops   Follow-up with family doctor if not improving about10 days.

## 2014-11-16 ENCOUNTER — Ambulatory Visit: Payer: Self-pay | Admitting: Family Medicine

## 2014-11-29 ENCOUNTER — Ambulatory Visit: Payer: BLUE CROSS/BLUE SHIELD | Admitting: Family Medicine

## 2014-12-03 ENCOUNTER — Encounter: Payer: Self-pay | Admitting: Family Medicine

## 2014-12-03 ENCOUNTER — Ambulatory Visit (INDEPENDENT_AMBULATORY_CARE_PROVIDER_SITE_OTHER): Payer: BLUE CROSS/BLUE SHIELD | Admitting: Family Medicine

## 2014-12-03 VITALS — BP 130/84 | HR 84 | Ht 63.0 in | Wt 344.0 lb

## 2014-12-03 DIAGNOSIS — Z13220 Encounter for screening for lipoid disorders: Secondary | ICD-10-CM

## 2014-12-03 DIAGNOSIS — E119 Type 2 diabetes mellitus without complications: Secondary | ICD-10-CM

## 2014-12-03 DIAGNOSIS — R7989 Other specified abnormal findings of blood chemistry: Secondary | ICD-10-CM

## 2014-12-03 DIAGNOSIS — I10 Essential (primary) hypertension: Secondary | ICD-10-CM | POA: Diagnosis not present

## 2014-12-03 DIAGNOSIS — E8881 Metabolic syndrome: Secondary | ICD-10-CM | POA: Diagnosis not present

## 2014-12-03 DIAGNOSIS — L309 Dermatitis, unspecified: Secondary | ICD-10-CM | POA: Diagnosis not present

## 2014-12-03 DIAGNOSIS — Z114 Encounter for screening for human immunodeficiency virus [HIV]: Secondary | ICD-10-CM

## 2014-12-03 DIAGNOSIS — Z720 Tobacco use: Secondary | ICD-10-CM

## 2014-12-03 LAB — POCT GLYCOSYLATED HEMOGLOBIN (HGB A1C): Hemoglobin A1C: 6.8

## 2014-12-03 MED ORDER — BLOOD GLUCOSE MONITORING SUPPL W/DEVICE KIT
PACK | Status: DC
Start: 2014-12-03 — End: 2015-06-14

## 2014-12-03 MED ORDER — SITAGLIPTIN-METFORMIN HCL ER 50-500 MG PO TB24
1.0000 | ORAL_TABLET | Freq: Every day | ORAL | Status: DC
Start: 2014-12-03 — End: 2015-09-12

## 2014-12-03 MED ORDER — PHENTERMINE HCL 37.5 MG PO CAPS: 37.5000 mg | ORAL_CAPSULE | ORAL | Status: DC

## 2014-12-03 NOTE — Patient Instructions (Addendum)
Lotion by Cetaphil for the hands.  Please test your sugar 3 times a week and bring in your monitor or write down your sugars.   Stop the metformin and can start the Janumet.

## 2014-12-03 NOTE — Progress Notes (Signed)
   Subjective:    Patient ID: Deborah Carrillo, female    DOB: [birth year], 34 y.o.   MRN: [id number]  HPI IFG - No inc thirst or urination.  She has been taking metformin but mostly once a day. She says sometimes it causes loose stools and often times she forgets the second tab.  Rash on forearms on and off for several months.   Noticed it after traveling to [place].  Notices it more when she is hot.  It is itchey. Scrathing makes more bumps. She does wash her hands frequently throughout the day. She occasionally wears gloves but not often.  Tob abuse - has cut down to 2 cig per days.    Review of Systems     Objective:   Physical Exam  Constitutional: She is oriented to person, place, and time. She appears well-developed and well-nourished.  HENT:  Head: Normocephalic and atraumatic.  Cardiovascular: Normal rate, regular rhythm and normal heart sounds.   Pulmonary/Chest: Effort normal and breath sounds normal.  Neurological: She is alert and oriented to person, place, and time.  Skin: Skin is warm and dry.  Psychiatric: She has a normal mood and affect. Her behavior is normal.          Assessment & Plan:  DM- New dx.  Discussed dx.  discussed cutting back on concentrated suckers and watching portion sizes on carbohydrates. We will get her prescription for glucometer, lancets, strips. Also will refer her to diabetes nutrition counseling. Her insurance would cover this. I will also change her metformin to Janumet.  Dermatitis-recommend hydration with a dye free perfume free thick lotion. If this isn't helping then we'll consider a topical low dose steroid. Avoid scratching.  Tobacco abuse-congratulated her on cutting down to 2 cigarettes per day.  Needs to schedule her pap smear.

## 2014-12-06 MED ORDER — GLUCOSE BLOOD VI STRP: ORAL_STRIP | Status: DC

## 2014-12-06 MED ORDER — ONETOUCH LANCETS MISC: Status: DC

## 2014-12-06 NOTE — Addendum Note (Signed)
Addended by: Deno EtienneBARKLEY, Karin Pinedo L on: 12/06/2014 04:32 PM   Modules accepted: Orders, Medications

## 2014-12-07 ENCOUNTER — Other Ambulatory Visit: Payer: Self-pay | Admitting: Family Medicine

## 2014-12-07 LAB — CBC
HEMATOCRIT: 36.5 % (ref 36.0–46.0)
HEMOGLOBIN: 12 g/dL (ref 12.0–15.0)
MCH: 24.3 pg — ABNORMAL LOW (ref 26.0–34.0)
MCHC: 32.9 g/dL (ref 30.0–36.0)
MCV: 73.9 fL — ABNORMAL LOW (ref 78.0–100.0)
MPV: 10 fL (ref 8.6–12.4)
Platelets: 258 10*3/uL (ref 150–400)
RBC: 4.94 MIL/uL (ref 3.87–5.11)
RDW: 16.7 % — ABNORMAL HIGH (ref 11.5–15.5)
WBC: 7.3 10*3/uL (ref 4.0–10.5)

## 2014-12-08 ENCOUNTER — Other Ambulatory Visit: Payer: Self-pay | Admitting: Family Medicine

## 2014-12-08 ENCOUNTER — Other Ambulatory Visit: Payer: Self-pay | Admitting: *Deleted

## 2014-12-08 DIAGNOSIS — R7989 Other specified abnormal findings of blood chemistry: Secondary | ICD-10-CM

## 2014-12-08 DIAGNOSIS — R790 Abnormal level of blood mineral: Secondary | ICD-10-CM

## 2014-12-08 LAB — COMPLETE METABOLIC PANEL WITH GFR
ALBUMIN: 3.5 g/dL (ref 3.5–5.2)
ALT: 21 U/L (ref 0–35)
AST: 16 U/L (ref 0–37)
Alkaline Phosphatase: 65 U/L (ref 39–117)
BUN: 6 mg/dL (ref 6–23)
CALCIUM: 8.8 mg/dL (ref 8.4–10.5)
CHLORIDE: 99 meq/L (ref 96–112)
CO2: 27 mEq/L (ref 19–32)
CREATININE: 0.64 mg/dL (ref 0.50–1.10)
GFR, Est African American: >89 mL/min
GFR, Est Non African American: >89 mL/min
Glucose, Bld: 112 mg/dL — ABNORMAL HIGH (ref 70–99)
Potassium: 4.1 mEq/L (ref 3.5–5.3)
SODIUM: 137 meq/L (ref 135–145)
TOTAL PROTEIN: 6.7 g/dL (ref 6.0–8.3)
Total Bilirubin: 0.4 mg/dL (ref 0.2–1.2)

## 2014-12-08 LAB — FERRITIN: FERRITIN: 19 ng/mL (ref 10–291)

## 2014-12-08 LAB — LIPID PANEL
Cholesterol: 201 mg/dL — ABNORMAL HIGH (ref 0–200)
HDL: 53 mg/dL (ref >=46)
LDL CALC: 109 mg/dL — AB (ref 0–99)
Total CHOL/HDL Ratio: 3.8 Ratio
Triglycerides: 193 mg/dL — ABNORMAL HIGH (ref <150)
VLDL: 39 mg/dL (ref 0–40)

## 2014-12-08 LAB — VITAMIN B12: Vitamin B-12: 301 pg/mL (ref 211–911)

## 2014-12-08 LAB — VITAMIN D 25 HYDROXY (VIT D DEFICIENCY, FRACTURES): VIT D 25 HYDROXY: 21 ng/mL — AB (ref 30–100)

## 2014-12-08 LAB — TSH: TSH: 5.02 u[IU]/mL — ABNORMAL HIGH (ref 0.350–4.500)

## 2014-12-08 LAB — HIV ANTIBODY (ROUTINE TESTING W REFLEX): HIV 1&2 Ab, 4th Generation: NONREACTIVE

## 2014-12-09 ENCOUNTER — Other Ambulatory Visit: Payer: Self-pay | Admitting: Family Medicine

## 2014-12-09 LAB — T4, FREE: FREE T4: 1.39 ng/dL (ref 0.80–1.80)

## 2015-01-08 ENCOUNTER — Other Ambulatory Visit: Payer: Self-pay | Admitting: Family Medicine

## 2015-01-11 ENCOUNTER — Other Ambulatory Visit: Payer: Self-pay | Admitting: Family Medicine

## 2015-01-11 ENCOUNTER — Telehealth: Payer: Self-pay | Admitting: *Deleted

## 2015-01-11 NOTE — Telephone Encounter (Signed)
Pt advised that she will need to call and make an appt for f/u of DM and wt loss med. She was given the scheduling # to call to make an appt.Loralee PacasBarkley, Tonisha Silvey RegisterLynetta

## 2015-01-14 ENCOUNTER — Encounter: Payer: Self-pay | Admitting: Family Medicine

## 2015-01-14 ENCOUNTER — Ambulatory Visit (INDEPENDENT_AMBULATORY_CARE_PROVIDER_SITE_OTHER): Payer: BLUE CROSS/BLUE SHIELD | Admitting: Family Medicine

## 2015-01-14 VITALS — BP 130/82 | HR 78 | Wt 320.0 lb

## 2015-01-14 DIAGNOSIS — E119 Type 2 diabetes mellitus without complications: Secondary | ICD-10-CM | POA: Diagnosis not present

## 2015-01-14 DIAGNOSIS — R635 Abnormal weight gain: Secondary | ICD-10-CM

## 2015-01-14 MED ORDER — PHENTERMINE HCL 37.5 MG PO CAPS: 37.5000 mg | ORAL_CAPSULE | ORAL | Status: DC

## 2015-01-14 NOTE — Progress Notes (Signed)
   Subjective:    Patient ID: Deborah Carrillo, female    DOB: June 29, 1981, 34 y.o.   MRN: [id number]  HPI Diabetes - no hypoglycemic events. No wounds or sores that are not healing well. No increased thirst or urination. Checking glucose at home. Taking medications as prescribed without any side effects. We changed her to Janumet 4 weeks ago bc her A1C was up. 7 day average is 114, the 14 days if 118, and the 30 days ave is 114.  Last a1C is 6.9   Abnormal weight gain- she is on phentermine for weight loss. She cut out soda and sweet.  Dec her carb. She has really increased her water intake. She has been walking a mile.    Review of Systems     Objective:   Physical Exam  Constitutional: She is oriented to person, place, and time. She appears well-developed and well-nourished.  HENT:  Head: Normocephalic and atraumatic.  Cardiovascular: Normal rate, regular rhythm and normal heart sounds.   Pulmonary/Chest: Effort normal and breath sounds normal.  Neurological: She is alert and oriented to person, place, and time.  Skin: Skin is warm and dry.  Psychiatric: She has a normal mood and affect. Her behavior is normal.          Assessment & Plan:  DM- blood pressures look good based on her home meter. Continue current regimen per Joe be due for repeat A1c in about 2 months. I expect that it'll look fantastic. She's tolerating the Janumet well but did have problems getting it refilled at the pharmacy. We called and clarified this as she should be able to pick it up today.  Abnormal weight gain - she is on fantastic with a weight loss of about 21 pounds. doing well on the phentermine.  No S.E.  Continue current regimen. Continue to work on diet and exercise. F/U in 1 month for nurse BP and weight check.  We discussed some strategies for breakfast. I still would strongly encourage her to consider getting in with a nutritionist.

## 2015-01-14 NOTE — Patient Instructions (Signed)
Fasting goal is between 80-120.   2 hours after you eat you want it under 170.   Ok to decrease sugar checks down to twice a week.

## 2015-01-20 ENCOUNTER — Other Ambulatory Visit: Payer: Self-pay | Admitting: Family Medicine

## 2015-02-05 ENCOUNTER — Other Ambulatory Visit: Payer: Self-pay | Admitting: Family Medicine

## 2015-02-11 ENCOUNTER — Ambulatory Visit (INDEPENDENT_AMBULATORY_CARE_PROVIDER_SITE_OTHER): Payer: BLUE CROSS/BLUE SHIELD | Admitting: Family Medicine

## 2015-02-11 VITALS — BP 132/98 | HR 90 | Wt 314.0 lb

## 2015-02-11 DIAGNOSIS — R635 Abnormal weight gain: Secondary | ICD-10-CM

## 2015-02-11 DIAGNOSIS — Z6841 Body Mass Index (BMI) 40.0 and over, adult: Secondary | ICD-10-CM | POA: Insufficient documentation

## 2015-02-11 MED ORDER — PHENTERMINE HCL 37.5 MG PO CAPS: 37.5000 mg | ORAL_CAPSULE | ORAL | Status: DC

## 2015-02-11 NOTE — Progress Notes (Signed)
   Subjective:    Patient ID: Deborah Carrillo, female    DOB: 04/05/1981, 34 y.o.   MRN: [id number]  HPI    Review of Systems     Objective:   Physical Exam        Assessment & Plan:  Abnormal weight gain/BMI 55-she is on fantastic. She's lost 6 pounds.  Nani Gasseratherine Metheney, MD

## 2015-02-11 NOTE — Progress Notes (Signed)
   Subjective:    Patient ID: Deborah Carrillo, female    DOB: 11-24-1980, 34 y.o.   MRN: [id number]  HPI Patient is here for blood pressure and weight check. Denies any trouble sleeping, palpitations, or any other medication problems. Upon review of Pt's chart, the last weight entered was 231lbs. This was an incorrect reading. Patient has a weight log and at the time of her last visit she was recording around 320-325lbs. Prior to that her weight was documented in the chart as 344lbs on 12/03/14.   Review of Systems     Objective:   Physical Exam        Assessment & Plan:  Patient has lost weight based on her weight log. A refill for Phentermine will be sent to patient preferred pharmacy. Patient advised to schedule a four week nurse visit and keep her upcoming appointment with her PCP. Verbalized understanding, no further questions.

## 2015-02-23 ENCOUNTER — Other Ambulatory Visit: Payer: Self-pay | Admitting: Family Medicine

## 2015-03-11 ENCOUNTER — Encounter: Payer: Self-pay | Admitting: Family Medicine

## 2015-03-11 ENCOUNTER — Ambulatory Visit (INDEPENDENT_AMBULATORY_CARE_PROVIDER_SITE_OTHER): Payer: BLUE CROSS/BLUE SHIELD | Admitting: Family Medicine

## 2015-03-11 VITALS — BP 122/78 | HR 86 | Ht 63.0 in | Wt 308.0 lb

## 2015-03-11 DIAGNOSIS — I10 Essential (primary) hypertension: Secondary | ICD-10-CM

## 2015-03-11 DIAGNOSIS — E119 Type 2 diabetes mellitus without complications: Secondary | ICD-10-CM | POA: Diagnosis not present

## 2015-03-11 LAB — POCT UA - MICROALBUMIN
CREATININE, POC: 100 mg/dL
MICROALBUMIN (UR) POC: 30 mg/L

## 2015-03-11 LAB — POCT GLYCOSYLATED HEMOGLOBIN (HGB A1C): HEMOGLOBIN A1C: 6

## 2015-03-11 MED ORDER — NYSTATIN 100000 UNIT/GM EX POWD: 1.0000 g | Freq: Two times a day (BID) | CUTANEOUS | Status: DC

## 2015-03-11 MED ORDER — PHENTERMINE HCL 37.5 MG PO CAPS: 37.5000 mg | ORAL_CAPSULE | ORAL | Status: DC

## 2015-03-11 NOTE — Progress Notes (Signed)
   Subjective:    Patient ID: Deborah Carrillo, female    DOB: 1981-02-19, 34 y.o.   MRN: [id number]  HPI Diabetes - no hypoglycemic events. No wounds or sores that are not healing well. No increased thirst or urination. Checking glucose at home. Taking medications as prescribed without any side effects. 7 days average is 105, then 14 days overage 104, and 30 days average is 102.    Hypertension- Pt denies chest pain, SOB, dizziness, or heart palpitations.  Taking meds as directed w/o problems.  Denies medication side effects.    Obesity/BMI 54 abnormal weight gain-she's actually lost another 6 pounds on the phentermine. She's tolerating it well without any side effects. She now got a Fit bit. She has been getting on the elipitical.  She has been trying to do the 10,000 steps per day.  She now now lost 42 lbs since last November.    Review of Systems     Objective:   Physical Exam  Constitutional: She is oriented to person, place, and time. She appears well-developed and well-nourished.  HENT:  Head: Normocephalic and atraumatic.  Cardiovascular: Normal rate, regular rhythm and normal heart sounds.   Pulmonary/Chest: Effort normal and breath sounds normal.  Neurological: She is alert and oriented to person, place, and time.  Skin: Skin is warm and dry.  Psychiatric: She has a normal mood and affect. Her behavior is normal.          Assessment & Plan:  DM- well controlled. A1C is down to 6.o from 6.8.  Reminded of eye exam Discussed need for pneumonia vaccine.    Hypertension- well controlled. Continue current regimen.  Obesity/BMI 51-she's doing fantastic with phentermine. She's down as 42 pounds over the last 6 months. We'll continue with the medication. Follow up in one month for nurse blood pressure and weight check.

## 2015-03-11 NOTE — Addendum Note (Signed)
Addended by: Deno Etienne on: 03/11/2015 08:45 AM   Modules accepted: Orders

## 2015-03-12 LAB — VITAMIN D 25 HYDROXY (VIT D DEFICIENCY, FRACTURES): Vit D, 25-Hydroxy: 28 ng/mL — ABNORMAL LOW (ref 30–100)

## 2015-03-12 LAB — FERRITIN: Ferritin: 12 ng/mL (ref 10–291)

## 2015-03-12 LAB — T4, FREE: Free T4: 1.64 ng/dL (ref 0.80–1.80)

## 2015-03-15 ENCOUNTER — Telehealth: Payer: Self-pay | Admitting: Family Medicine

## 2015-03-15 MED ORDER — VITAMIN D (ERGOCALCIFEROL) 1.25 MG (50000 UNIT) PO CAPS: 50000.0000 [IU] | ORAL_CAPSULE | ORAL | Status: DC

## 2015-03-15 NOTE — Telephone Encounter (Signed)
Deborah Carrillo, Will you please let patient know that her vitamin d level remains low but is improving, i'd recommend she start a weekly supplement to help boost her levels, i've sent this to [facility] in [place].  Ferritin and thyroid function were normal. Dr. Judie PetitM may have additional recommendations when she returns

## 2015-03-16 NOTE — Telephone Encounter (Signed)
vm not set up. Will try again later.Deborah PacasBarkley, Deborah Chavers AbernathyLynetta

## 2015-03-16 NOTE — Telephone Encounter (Signed)
Pt informed.Deborah Carrillo [name]  

## 2015-03-26 ENCOUNTER — Other Ambulatory Visit: Payer: Self-pay | Admitting: Family Medicine

## 2015-04-08 ENCOUNTER — Ambulatory Visit (INDEPENDENT_AMBULATORY_CARE_PROVIDER_SITE_OTHER): Payer: BLUE CROSS/BLUE SHIELD | Admitting: Family Medicine

## 2015-04-08 VITALS — BP 152/100 | HR 97 | Wt 299.0 lb

## 2015-04-08 DIAGNOSIS — R030 Elevated blood-pressure reading, without diagnosis of hypertension: Secondary | ICD-10-CM | POA: Diagnosis not present

## 2015-04-08 DIAGNOSIS — R635 Abnormal weight gain: Secondary | ICD-10-CM | POA: Diagnosis not present

## 2015-04-08 DIAGNOSIS — IMO0001 Reserved for inherently not codable concepts without codable children: Secondary | ICD-10-CM

## 2015-04-08 NOTE — Progress Notes (Signed)
   Subjective:    Patient ID: Deborah Carrillo, female    DOB: Nov 30, 1980, 34 y.o.   MRN: [id number]  HPI  Deborah Carrillo is here for blood pressure and weight check. Diet and exercise is going well. Denies trouble sleeping or palpitations.   Review of Systems     Objective:   Physical Exam        Assessment & Plan:  Abnormal weight gain-Blood pressure elevated. She did not take her blood pressure medication this morning. Stressed the importance of taking her medication while being on the phentermine as the phentermine by itself can definitely raise her blood pressure and this would put her at high risk for complications. Encouraged her return on another day when she has taken her medication to confirm that her blood pressure looks good and at that point if she is at goal then we can refill her phentermine.  Nani Gasser, MD

## 2015-04-20 ENCOUNTER — Other Ambulatory Visit: Payer: Self-pay | Admitting: Family Medicine

## 2015-05-02 ENCOUNTER — Other Ambulatory Visit: Payer: Self-pay | Admitting: Family Medicine

## 2015-05-05 ENCOUNTER — Ambulatory Visit (INDEPENDENT_AMBULATORY_CARE_PROVIDER_SITE_OTHER): Payer: BLUE CROSS/BLUE SHIELD | Admitting: Family Medicine

## 2015-05-05 VITALS — BP 132/84 | HR 80 | Wt 308.0 lb

## 2015-05-05 DIAGNOSIS — R635 Abnormal weight gain: Secondary | ICD-10-CM

## 2015-05-05 MED ORDER — PHENTERMINE HCL 37.5 MG PO CAPS: 37.5000 mg | ORAL_CAPSULE | ORAL | Status: DC

## 2015-05-05 NOTE — Progress Notes (Signed)
   Subjective:    Patient ID: Deborah Carrillo, female    DOB: 05-May-1981, 34 y.o.   MRN: [id number]  HPI  Patient is here for blood pressure and weight check. Denies any trouble sleeping, palpitations, or any other medication problems. Pt states she has been out of her Phentermine Rx for the past month and has made some poor diet changes while off the Rx. Pt really wants to begin taking the Rx again, states she felt it helped her a lot.  Review of Systems     Objective:   Physical Exam        Assessment & Plan:  Patient has not lost weight. A refill for Phentermine will be sent to Provider for review. Patient advised we will contact her if a new Rx will be written and if she needs to schedule a four week nurse visit. Pt was advised to keep her upcoming appointment with her PCP. Verbalized understanding, no further questions.   Agree with above.  Ok to refill but really needs to make sure watching calories and diet.    Nani Gasser, MD

## 2015-05-05 NOTE — Progress Notes (Signed)
Pt advised of Rx and recommendations. Pt was very grateful for the opportunity to begin Rx again. Rx faxed to Pt preferred pharmacy.

## 2015-06-03 ENCOUNTER — Encounter: Payer: BLUE CROSS/BLUE SHIELD | Admitting: Family Medicine

## 2015-06-05 ENCOUNTER — Other Ambulatory Visit: Payer: Self-pay | Admitting: Family Medicine

## 2015-06-07 ENCOUNTER — Encounter: Payer: BLUE CROSS/BLUE SHIELD | Admitting: Family Medicine

## 2015-06-14 ENCOUNTER — Other Ambulatory Visit (HOSPITAL_COMMUNITY)
Admission: RE | Admit: 2015-06-14 | Discharge: 2015-06-14 | Disposition: A | Discharge status: Home / Self Care | Payer: BLUE CROSS/BLUE SHIELD | Source: Ambulatory Visit | Attending: Family Medicine | Admitting: Family Medicine

## 2015-06-14 ENCOUNTER — Encounter: Payer: Self-pay | Admitting: Family Medicine

## 2015-06-14 ENCOUNTER — Ambulatory Visit (INDEPENDENT_AMBULATORY_CARE_PROVIDER_SITE_OTHER): Payer: BLUE CROSS/BLUE SHIELD | Admitting: Family Medicine

## 2015-06-14 VITALS — BP 110/78 | HR 72 | Temp 98.2°F | Ht 63.0 in | Wt 299.0 lb

## 2015-06-14 DIAGNOSIS — Z0000 Encounter for general adult medical examination without abnormal findings: Secondary | ICD-10-CM | POA: Diagnosis not present

## 2015-06-14 DIAGNOSIS — E119 Type 2 diabetes mellitus without complications: Secondary | ICD-10-CM | POA: Diagnosis not present

## 2015-06-14 DIAGNOSIS — R7989 Other specified abnormal findings of blood chemistry: Secondary | ICD-10-CM

## 2015-06-14 DIAGNOSIS — Z1151 Encounter for screening for human papillomavirus (HPV): Secondary | ICD-10-CM | POA: Insufficient documentation

## 2015-06-14 DIAGNOSIS — I10 Essential (primary) hypertension: Secondary | ICD-10-CM | POA: Diagnosis not present

## 2015-06-14 DIAGNOSIS — R6882 Decreased libido: Secondary | ICD-10-CM | POA: Diagnosis not present

## 2015-06-14 DIAGNOSIS — Z01419 Encounter for gynecological examination (general) (routine) without abnormal findings: Secondary | ICD-10-CM | POA: Diagnosis present

## 2015-06-14 DIAGNOSIS — Z23 Encounter for immunization: Secondary | ICD-10-CM

## 2015-06-14 LAB — POCT GLYCOSYLATED HEMOGLOBIN (HGB A1C): Hemoglobin A1C: 5.6

## 2015-06-14 NOTE — Progress Notes (Signed)
Subjective:     Deborah Carrillo is a 34 y.o. female and is here for a comprehensive physical exam. The patient reports problems - low sex drive.  Social History   Social History  . Marital Status: Married    Spouse Name: N/A  . Number of Children: N/A  . Years of Education: N/A   Occupational History  . daycare worker    Social History Main Topics  . Smoking status: Current Every Day Smoker -- 0.50 packs/day for 14 years    Types: Cigarettes    Last Attempt to Quit: 03/28/2011  . Smokeless tobacco: Never Used  . Alcohol Use: No  . Drug Use: No  . Sexual Activity:    Partners: Male   Other Topics Concern  . Not on file   Social History Narrative   Health Maintenance  Topic Date Due  . PNEUMOCOCCAL POLYSACCHARIDE VACCINE (1) 03/03/1982  . FOOT EXAM  03/03/1990  . OPHTHALMOLOGY EXAM  03/03/1990  . PAP SMEAR  10/03/2013  . INFLUENZA VACCINE  04/18/2015  . HEMOGLOBIN A1C  09/10/2015  . URINE MICROALBUMIN  03/10/2016  . TETANUS/TDAP  08/22/2022  . HIV Screening  Completed    The following portions of the patient's history were reviewed and updated as appropriate: allergies, current medications, past family history, past medical history, past social history, past surgical history and problem list.  Review of Systems A comprehensive review of systems was negative.   Objective:    BP 110/78 mmHg  Pulse 72  Temp(Src) 98.2 F (36.8 C)  Ht  (1.6 m)  Wt 299 lb (135.626 kg)  BMI 52.98 kg/m2 General appearance: alert, cooperative and appears stated age Head: Normocephalic, without obvious abnormality, atraumatic Eyes: conj clear, EOMI, PEERLA Ears: normal TM's and external ear canals both ears Nose: Nares normal. Septum midline. Mucosa normal. No drainage or sinus tenderness. Throat: lips, mucosa, and tongue normal; teeth and gums normal Neck: no adenopathy, no carotid bruit, no JVD, supple, symmetrical, trachea midline and thyroid not enlarged, symmetric, no  tenderness/mass/nodules Back: symmetric, no curvature. ROM normal. No CVA tenderness. Lungs: clear to auscultation bilaterally Breasts: normal appearance, no masses or tenderness Heart: regular rate and rhythm, S1, S2 normal, no murmur, click, rub or gallop Abdomen: soft, non-tender; bowel sounds normal; no masses,  no organomegaly Pelvic: cervix normal in appearance, external genitalia normal, no adnexal masses or tenderness, no cervical motion tenderness, rectovaginal septum normal, uterus normal size, shape, and consistency and vagina normal without discharge Extremities: extremities normal, atraumatic, no cyanosis or edema Pulses: 2+ and symmetric Skin: Skin color, texture, turgor normal. No rashes or lesions Lymph nodes: Cervical, supraclavicular, and axillary nodes normal. Neurologic: Alert and oriented X 3, normal strength and tone. Normal symmetric reflexes. Normal coordination and gait    Assessment:    Healthy female exam.     Plan:     See After Visit Summary for Counseling Recommendations   Keep up a regular exercise program and make sure you are eating a healthy diet Try to eat 4 servings of dairy a day, or if you are lactose intolerant take a calcium with vitamin D daily.  Your vaccines are up to date.   Diabetes - no hypoglycemic events. No wounds or sores that are not healing well. No increased thirst or urination. Checking glucose at home. Taking medications as prescribed without any side effects. Well controlled. F/U in 3-4 months.    HTN - well controlled. And tingling current regimen.  Low libido -  discussed strategies. She is having some discomfort at times with intercourse as well. Certainly discussed several options including therapist that help work with women who particular discomfort with intercourse and have a lot of pelvic discomfort. We also discussed possibly considering Wellbutrin which can help libido. We also discussed just some strategies at home to help  make more private time for her and her husband. She has a teenage son who lives at home and feels like when he's fair she doesn't want to engage in sexual activity so that has been a big deterrent. We discussed maybe have date nights where he is out of the house or home. For a few hours.

## 2015-06-14 NOTE — Patient Instructions (Signed)
Keep up a regular exercise program and make sure you are eating a healthy diet Try to eat 4 servings of dairy a day, or if you are lactose intolerant take a calcium with vitamin D daily.  Your vaccines are up to date.   

## 2015-06-15 LAB — CYTOLOGY - PAP

## 2015-06-27 ENCOUNTER — Other Ambulatory Visit: Payer: Self-pay | Admitting: *Deleted

## 2015-06-27 MED ORDER — PHENTERMINE HCL 37.5 MG PO CAPS: 37.5000 mg | ORAL_CAPSULE | ORAL | Status: DC

## 2015-07-02 ENCOUNTER — Other Ambulatory Visit: Payer: Self-pay | Admitting: Family Medicine

## 2015-07-18 ENCOUNTER — Other Ambulatory Visit: Payer: Self-pay | Admitting: Family Medicine

## 2015-09-01 ENCOUNTER — Ambulatory Visit: Payer: BLUE CROSS/BLUE SHIELD

## 2015-09-12 ENCOUNTER — Other Ambulatory Visit: Payer: Self-pay | Admitting: Family Medicine

## 2015-10-09 ENCOUNTER — Other Ambulatory Visit: Payer: Self-pay | Admitting: Family Medicine

## 2015-10-13 ENCOUNTER — Encounter: Payer: Self-pay | Admitting: Family Medicine

## 2015-10-13 ENCOUNTER — Ambulatory Visit (INDEPENDENT_AMBULATORY_CARE_PROVIDER_SITE_OTHER): Payer: Managed Care, Other (non HMO) | Admitting: Family Medicine

## 2015-10-13 VITALS — BP 122/68 | HR 78 | Ht 63.0 in | Wt 307.0 lb

## 2015-10-13 DIAGNOSIS — Z720 Tobacco use: Secondary | ICD-10-CM

## 2015-10-13 DIAGNOSIS — R635 Abnormal weight gain: Secondary | ICD-10-CM | POA: Diagnosis not present

## 2015-10-13 DIAGNOSIS — I10 Essential (primary) hypertension: Secondary | ICD-10-CM

## 2015-10-13 DIAGNOSIS — E119 Type 2 diabetes mellitus without complications: Secondary | ICD-10-CM

## 2015-10-13 LAB — POCT GLYCOSYLATED HEMOGLOBIN (HGB A1C): Hemoglobin A1C: 6.3

## 2015-10-13 MED ORDER — PHENTERMINE HCL 37.5 MG PO CAPS: 37.5000 mg | ORAL_CAPSULE | ORAL | Status: DC

## 2015-10-13 MED ORDER — CANAGLIFLOZIN-METFORMIN HCL ER 50-1000 MG PO TB24: 1.0000 | ORAL_TABLET | Freq: Every day | ORAL | Status: DC

## 2015-10-13 MED ORDER — SITAGLIP PHOS-METFORMIN HCL ER 50-500 MG PO TB24: 1.0000 | ORAL_TABLET | Freq: Every day | ORAL | Status: DC

## 2015-10-13 MED ORDER — METOPROLOL TARTRATE 100 MG PO TABS: 100.0000 mg | ORAL_TABLET | Freq: Two times a day (BID) | ORAL | Status: DC

## 2015-10-13 NOTE — Progress Notes (Signed)
Subjective:    Patient ID: Deborah Carrillo, female    DOB: 03/25/1981, 34 y.o.   MRN: [id number]  HPI Hypertension- Pt denies chest pain, SOB, dizziness, or heart palpitations.  Taking meds as directed w/o problems.  Denies medication side effects.    Diabetes - no hypoglycemic events. No wounds or sores that are not healing well. No increased thirst or urination. Checking glucose at home. Taking medications as prescribed without any side effects. She is on Janumet.    Tob abuse - still smoking. She had a bad upper respiratory infection the says she is finally getting over it. She still has a mild cough.  Review of Systems     Objective:   Physical Exam  Constitutional: She is oriented to person, place, and time. She appears well-developed and well-nourished.  HENT:  Head: Normocephalic and atraumatic.  Cardiovascular: Normal rate, regular rhythm and normal heart sounds.   Pulmonary/Chest: Effort normal and breath sounds normal.  Neurological: She is alert and oriented to person, place, and time.  Skin: Skin is warm and dry.  Psychiatric: She has a normal mood and affect. Her behavior is normal.          Assessment & Plan:  HTN - well controlled. She's no longer going to be able to afford the metoprolol XL. It started causing her $60 with her insurance and now she has a $3000 deductible. We'll switch to regular release metoprolol 100 mg twice a day. With diet and exercise we might be able to control her blood pressure with his regimen. If not then we'll need to increase back to 200 mg twice a day.  DM- well controlled but up from last time. A1C is 6.3. Her to get back on track with diet and exercise. She has gained over 8 pounds since her last office visit. She is unable to get an eye exam at this time but will check into it with a new insurance. She has not gone in well over a year. The plan will be to run through the Janumet XR which she is currently taking with a coupon card. If  it's still quite expensive than we'll try switching to invoke met XR with a coupon card and see if that is better covered.  Tob abuse - encourage smoking cessation. Next  Abnormal weight gain-will restart phentermine. She will need to check with her insurance though she may not be able to come in for regular office visits. She did well with it in the past without any chest pain or palpitations or rise in blood pressure. 

## 2015-10-14 LAB — LIPID PANEL
Cholesterol: 204 mg/dL — ABNORMAL HIGH (ref 125–200)
HDL: 57 mg/dL (ref >=46)
LDL CALC: 124 mg/dL (ref <130)
TRIGLYCERIDES: 117 mg/dL (ref <150)
Total CHOL/HDL Ratio: 3.6 Ratio (ref <=5.0)
VLDL: 23 mg/dL (ref <30)

## 2015-10-14 LAB — COMPLETE METABOLIC PANEL WITH GFR
ALT: 12 U/L (ref 6–29)
AST: 13 U/L (ref 10–30)
Albumin: 3.4 g/dL — ABNORMAL LOW (ref 3.6–5.1)
Alkaline Phosphatase: 64 U/L (ref 33–115)
BILIRUBIN TOTAL: 0.4 mg/dL (ref 0.2–1.2)
BUN: 9 mg/dL (ref 7–25)
CO2: 26 mmol/L (ref 20–31)
Calcium: 8.5 mg/dL — ABNORMAL LOW (ref 8.6–10.2)
Chloride: 103 mmol/L (ref 98–110)
Creat: 0.65 mg/dL (ref 0.50–1.10)
GFR, Est Non African American: >89 mL/min (ref >=60)
Glucose, Bld: 78 mg/dL (ref 65–99)
POTASSIUM: 4.4 mmol/L (ref 3.5–5.3)
Sodium: 137 mmol/L (ref 135–146)
Total Protein: 6.7 g/dL (ref 6.1–8.1)

## 2015-10-14 LAB — VITAMIN D 25 HYDROXY (VIT D DEFICIENCY, FRACTURES): Vit D, 25-Hydroxy: 22 ng/mL — ABNORMAL LOW (ref 30–100)

## 2015-10-18 ENCOUNTER — Other Ambulatory Visit: Payer: Self-pay

## 2015-10-18 MED ORDER — CANAGLIFLOZIN-METFORMIN HCL ER 50-1000 MG PO TB24: 1.0000 | ORAL_TABLET | Freq: Every day | ORAL | Status: DC

## 2015-12-28 ENCOUNTER — Other Ambulatory Visit: Payer: Self-pay | Admitting: Family Medicine

## 2016-01-01 ENCOUNTER — Other Ambulatory Visit: Payer: Self-pay | Admitting: Family Medicine

## 2016-01-09 ENCOUNTER — Telehealth: Payer: Self-pay | Admitting: Family Medicine

## 2016-01-09 NOTE — Telephone Encounter (Signed)
FYI Pt missed her DM appt today. She will call back to reschedule

## 2016-01-10 ENCOUNTER — Ambulatory Visit: Payer: Managed Care, Other (non HMO) | Admitting: Family Medicine

## 2016-02-27 ENCOUNTER — Other Ambulatory Visit: Payer: Self-pay | Admitting: Family Medicine

## 2016-03-14 ENCOUNTER — Telehealth: Payer: Self-pay | Admitting: Family Medicine

## 2016-03-14 NOTE — Telephone Encounter (Signed)
Talk to pt and informed her she needs a DM f/u appt with Dr. Linford ArnoldMetheney and she states she will call back to schedule

## 2016-04-07 ENCOUNTER — Other Ambulatory Visit: Payer: Self-pay | Admitting: Family Medicine

## 2016-04-22 ENCOUNTER — Other Ambulatory Visit: Payer: Self-pay | Admitting: Family Medicine

## 2016-04-30 ENCOUNTER — Other Ambulatory Visit: Payer: Self-pay | Admitting: Family Medicine

## 2016-05-11 ENCOUNTER — Ambulatory Visit (INDEPENDENT_AMBULATORY_CARE_PROVIDER_SITE_OTHER): Payer: Managed Care, Other (non HMO) | Admitting: Family Medicine

## 2016-05-11 ENCOUNTER — Encounter: Payer: Self-pay | Admitting: Family Medicine

## 2016-05-11 VITALS — BP 126/72 | HR 67 | Ht 63.0 in | Wt 314.0 lb

## 2016-05-11 DIAGNOSIS — Z23 Encounter for immunization: Secondary | ICD-10-CM | POA: Diagnosis not present

## 2016-05-11 DIAGNOSIS — I10 Essential (primary) hypertension: Secondary | ICD-10-CM | POA: Diagnosis not present

## 2016-05-11 DIAGNOSIS — Z6841 Body Mass Index (BMI) 40.0 and over, adult: Secondary | ICD-10-CM

## 2016-05-11 DIAGNOSIS — R635 Abnormal weight gain: Secondary | ICD-10-CM

## 2016-05-11 DIAGNOSIS — E119 Type 2 diabetes mellitus without complications: Secondary | ICD-10-CM | POA: Diagnosis not present

## 2016-05-11 LAB — POCT GLYCOSYLATED HEMOGLOBIN (HGB A1C): Hemoglobin A1C: 6.1

## 2016-05-11 LAB — POCT UA - MICROALBUMIN
CREATININE, POC: 200 mg/dL
MICROALBUMIN (UR) POC: 150 mg/L

## 2016-05-11 MED ORDER — LORCASERIN HCL 10 MG PO TABS: 1.0000 | ORAL_TABLET | Freq: Two times a day (BID) | ORAL | 1 refills | Status: DC

## 2016-05-11 NOTE — Progress Notes (Signed)
Subjective:    CC: DM  HPI:  Diabetes - no hypoglycemic events. No wounds or sores that are not healing well. No increased thirst or urination. Checking glucose at home. Taking medications as prescribed without any side effects.  Hypertension- Pt denies chest pain, SOB, dizziness, or heart palpitations.  Taking meds as directed w/o problems.  Denies medication side effects.    Abnormal weight gain - she got a promotion and it has been more stressful. She says she has been stress eating more.  Had taken phentermine previously.     Past medical history, Surgical history, Family history not pertinant except as noted below, Social history, Allergies, and medications have been entered into the medical record, reviewed, and corrections made.   Review of Systems: No fevers, chills, night sweats, weight loss, chest pain, or shortness of breath.   Objective:    General: Well Developed, well nourished, and in no acute distress.  Neuro: Alert and oriented x3, extra-ocular muscles intact, sensation grossly intact.  HEENT: Normocephalic, atraumatic  Skin: Warm and dry, no rashes. Cardiac: Regular rate and rhythm, no murmurs rubs or gallops, no lower extremity edema.  Respiratory: Clear to auscultation bilaterally. Not using accessory muscles, speaking in full sentences.   Impression and Recommendations:   DM- Well controlled. Continue current regimen. Follow up in 3 months. Also discussed working on reducing stress levels and getting regular exercise.  HTN - Well controlled. Continue current regimen. Follow up in  6 month.    Abnormal weight gain/BMI > 50 - Discussed options Will try Belviq.  F/U in 6 weeks if doing wel.  New Rx sent. Insurance may not cover it.   Offered to refer to bariatric center.

## 2016-05-26 ENCOUNTER — Other Ambulatory Visit: Payer: Self-pay | Admitting: Family Medicine

## 2016-05-28 ENCOUNTER — Other Ambulatory Visit: Payer: Self-pay | Admitting: Family Medicine

## 2016-05-30 ENCOUNTER — Telehealth: Payer: Self-pay

## 2016-05-30 NOTE — Telephone Encounter (Signed)
Pt reports that belviq is too expensive.

## 2016-05-31 NOTE — Telephone Encounter (Signed)
She may hae to call her inusrance and see what might be covered that is similar for weight loss.

## 2016-06-10 ENCOUNTER — Other Ambulatory Visit: Payer: Self-pay | Admitting: Family Medicine

## 2016-08-08 ENCOUNTER — Ambulatory Visit: Payer: Managed Care, Other (non HMO) | Admitting: Family Medicine

## 2016-08-26 ENCOUNTER — Other Ambulatory Visit: Payer: Self-pay | Admitting: Family Medicine

## 2016-11-06 ENCOUNTER — Other Ambulatory Visit: Payer: Self-pay | Admitting: Family Medicine

## 2016-12-18 ENCOUNTER — Other Ambulatory Visit: Payer: Self-pay | Admitting: Family Medicine

## 2016-12-18 ENCOUNTER — Other Ambulatory Visit: Payer: Self-pay

## 2016-12-18 MED ORDER — METOPROLOL TARTRATE 100 MG PO TABS: 100.0000 mg | ORAL_TABLET | Freq: Two times a day (BID) | ORAL | 0 refills | Status: DC

## 2016-12-25 ENCOUNTER — Ambulatory Visit (INDEPENDENT_AMBULATORY_CARE_PROVIDER_SITE_OTHER): Payer: Managed Care, Other (non HMO) | Admitting: Family Medicine

## 2016-12-25 ENCOUNTER — Encounter: Payer: Self-pay | Admitting: Family Medicine

## 2016-12-25 ENCOUNTER — Telehealth: Payer: Self-pay | Admitting: Family Medicine

## 2016-12-25 VITALS — BP 130/78 | Ht 63.0 in | Wt 314.0 lb

## 2016-12-25 DIAGNOSIS — Z111 Encounter for screening for respiratory tuberculosis: Secondary | ICD-10-CM | POA: Diagnosis not present

## 2016-12-25 DIAGNOSIS — E119 Type 2 diabetes mellitus without complications: Secondary | ICD-10-CM

## 2016-12-25 DIAGNOSIS — I10 Essential (primary) hypertension: Secondary | ICD-10-CM

## 2016-12-25 DIAGNOSIS — Z0000 Encounter for general adult medical examination without abnormal findings: Secondary | ICD-10-CM

## 2016-12-25 LAB — POCT GLYCOSYLATED HEMOGLOBIN (HGB A1C): Hemoglobin A1C: 6

## 2016-12-25 MED ORDER — NORGESTIM-ETH ESTRAD TRIPHASIC 0.18/0.215/0.25 MG-35 MCG PO TABS: ORAL_TABLET | ORAL | 1 refills | Status: DC

## 2016-12-25 MED ORDER — METOPROLOL TARTRATE 100 MG PO TABS: 100.0000 mg | ORAL_TABLET | Freq: Two times a day (BID) | ORAL | 3 refills | Status: DC

## 2016-12-25 NOTE — Patient Instructions (Signed)
Keep up a regular exercise program and make sure you are eating a healthy diet Try to eat 4 servings of dairy a day, or if you are lactose intolerant take a calcium with vitamin D daily.  Your vaccines are up to date.   

## 2016-12-25 NOTE — Progress Notes (Signed)
Subjective:     Deborah Carrillo is a 36 y.o. female and is here for a comprehensive physical exam. The patient reports problems - mother recently dx with Staghe 4 Lung Ca .This is been stressful and she's not been sleeping as well that she admits to sleep and she's been going on for a long time. She does drink energy drinks during the day though. 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 She just started last night. She's not currently exercising. She does need a form completed as well as a TB skin test.  Diabetes - no hypoglycemic events. No wounds or sores that are not healing well. No increased thirst or urination. Checking glucose at home. Taking medications as prescribed without any side effects.   Hypertension- Pt denies chest pain, SOB, dizziness, or heart palpitations.  Taking meds as directed w/o problems.  Denies medication side effects.      Social History   Social History  . Marital status: Married    Spouse name: N/A  . Number of children: N/A  . Years of education: N/A   Occupational History  . daycare worker Press photographer   Social History Main Topics  . Smoking status: Current Every Day Smoker    Packs/day: 0.50    Years: 14.00    Types: Cigarettes    Last attempt to quit: 03/28/2011  . Smokeless tobacco: Never Used  . Alcohol use No  . Drug use: No  . Sexual activity: Yes    Partners: Male   Other Topics Concern  . Not on file   Social History Narrative  . No narrative on file   Health Maintenance  Topic Date Due  . PNEUMOCOCCAL POLYSACCHARIDE VACCINE (1) 03/03/1982  . OPHTHALMOLOGY EXAM  03/03/1990  . FOOT EXAM  06/13/2016  . HEMOGLOBIN A1C  11/11/2016  . INFLUENZA VACCINE  04/17/2017  . URINE MICROALBUMIN  05/11/2017  . PAP SMEAR  06/13/2018  . TETANUS/TDAP  08/22/2022  . HIV Screening  Completed    The following portions of the patient's history were reviewed and  updated as appropriate: allergies, current medications, past family history, past medical history, past social history, past surgical history and problem list.  Review of Systems A comprehensive review of systems was negative.   Objective:    BP (!) 150/90   Ht  (1.6 m)   Wt (!) 314 lb (142.4 kg)   BMI 55.62 kg/m   General appearance: alert, cooperative and appears stated age Head: Normocephalic, without obvious abnormality, atraumatic Eyes: EOM,PEERLA conj clear, Ears: normal TM's and external ear canals both ears Nose: Nares normal. Septum midline. Mucosa normal. No drainage or sinus tenderness. Throat: lips, mucosa, and tongue normal; teeth and gums normal Neck: no adenopathy, no carotid bruit, no JVD, supple, symmetrical, trachea midline and thyroid not enlarged, symmetric, no tenderness/mass/nodules Back: symmetric, no curvature. ROM normal. No CVA tenderness. Lungs: clear to auscultation bilaterally Breasts: normal appearance, no masses or tenderness Heart: regular rate and rhythm, S1, S2 normal, no murmur, click, rub or gallop Abdomen: soft, non-tender; bowel sounds normal; no masses,  no organomegaly Extremities: extremities normal, atraumatic, no cyanosis or edema Pulses: 2+ and symmetric Skin: Skin color, texture, turgor normal. No rashes or lesions Lymph nodes: Cervical, supraclavicular, and axillary nodes normal. Neurologic: Alert and oriented X 3, normal strength and tone. Normal symmetric reflexes. Normal coordination and gait  Assessment:    Healthy female exam.      Plan:     See After Visit Summary for Counseling Recommendations   Keep up a regular exercise program and make sure you are eating a healthy diet Try to eat 4 servings of dairy a day, or if you are lactose intolerant take a calcium with vitamin D daily.  Your vaccines are up to date.   DM- STable from previous. Continue to work on diet and exercise and f/u in 6 mo.  Due for full labs. Slip  given today.  Reminded to get eye exam.   Lab Results  Component Value Date   HGBA1C 6.0 12/25/2016     HTN - Well controlled. Continue current regimen. Follow up in  6 month.

## 2016-12-25 NOTE — Telephone Encounter (Signed)
Please call pt and see when and were last eye exam with her diabetes.

## 2016-12-27 ENCOUNTER — Ambulatory Visit (INDEPENDENT_AMBULATORY_CARE_PROVIDER_SITE_OTHER): Payer: Managed Care, Other (non HMO) | Admitting: Family Medicine

## 2016-12-27 VITALS — BP 128/74 | HR 71

## 2016-12-27 DIAGNOSIS — Z111 Encounter for screening for respiratory tuberculosis: Secondary | ICD-10-CM

## 2016-12-27 LAB — COMPLETE METABOLIC PANEL WITH GFR
ALT: 13 U/L (ref 6–29)
AST: 13 U/L (ref 10–30)
Albumin: 3.5 g/dL — ABNORMAL LOW (ref 3.6–5.1)
Alkaline Phosphatase: 66 U/L (ref 33–115)
BUN: 9 mg/dL (ref 7–25)
CHLORIDE: 104 mmol/L (ref 98–110)
CO2: 24 mmol/L (ref 20–31)
Calcium: 8.7 mg/dL (ref 8.6–10.2)
Creat: 0.73 mg/dL (ref 0.50–1.10)
GFR, Est African American: >89 mL/min (ref >=60)
GFR, Est Non African American: >89 mL/min (ref >=60)
GLUCOSE: 96 mg/dL (ref 65–99)
POTASSIUM: 4 mmol/L (ref 3.5–5.3)
SODIUM: 139 mmol/L (ref 135–146)
Total Bilirubin: 0.4 mg/dL (ref 0.2–1.2)
Total Protein: 6.8 g/dL (ref 6.1–8.1)

## 2016-12-27 LAB — TB SKIN TEST
Induration: 0 mm
TB SKIN TEST: NEGATIVE

## 2016-12-27 LAB — LIPID PANEL
CHOL/HDL RATIO: 3.7 ratio (ref <5.0)
Cholesterol: 212 mg/dL — ABNORMAL HIGH (ref <200)
HDL: 58 mg/dL (ref >50)
LDL CALC: 122 mg/dL — AB (ref <100)
Triglycerides: 160 mg/dL — ABNORMAL HIGH (ref <150)
VLDL: 32 mg/dL — AB (ref <30)

## 2016-12-27 LAB — TSH: TSH: 3.26 m[IU]/L

## 2016-12-27 NOTE — Progress Notes (Signed)
Pt came into clinic today for PPD test read. Test was negative, 0mm induration. Forms for work completed and given to Patient. No further questions/concerns.

## 2016-12-27 NOTE — Telephone Encounter (Signed)
Spoke with pt in office today.  She has not had an eye exam in several years but will get an appointment scheduled.

## 2016-12-28 ENCOUNTER — Encounter: Payer: Self-pay | Admitting: Family Medicine

## 2016-12-28 DIAGNOSIS — E785 Hyperlipidemia, unspecified: Secondary | ICD-10-CM | POA: Insufficient documentation

## 2016-12-28 HISTORY — DX: Hyperlipidemia, unspecified: E78.5

## 2017-01-01 ENCOUNTER — Telehealth: Payer: Self-pay

## 2017-01-01 NOTE — Telephone Encounter (Signed)
Pt called back for lab results, Given.

## 2017-01-03 ENCOUNTER — Other Ambulatory Visit: Payer: Self-pay | Admitting: Family Medicine

## 2017-03-26 ENCOUNTER — Ambulatory Visit: Payer: Managed Care, Other (non HMO) | Admitting: Family Medicine

## 2017-04-20 ENCOUNTER — Other Ambulatory Visit: Payer: Self-pay | Admitting: Family Medicine

## 2017-05-21 ENCOUNTER — Ambulatory Visit (INDEPENDENT_AMBULATORY_CARE_PROVIDER_SITE_OTHER): Payer: Managed Care, Other (non HMO) | Admitting: Physician Assistant

## 2017-05-21 ENCOUNTER — Encounter: Payer: Self-pay | Admitting: Physician Assistant

## 2017-05-21 ENCOUNTER — Ambulatory Visit (HOSPITAL_BASED_OUTPATIENT_CLINIC_OR_DEPARTMENT_OTHER)
Admission: RE | Admit: 2017-05-21 | Discharge: 2017-05-21 | Disposition: A | Discharge status: Home / Self Care | Payer: Managed Care, Other (non HMO) | Source: Ambulatory Visit | Attending: Physician Assistant | Admitting: Physician Assistant

## 2017-05-21 VITALS — BP 150/88 | Wt 318.0 lb

## 2017-05-21 DIAGNOSIS — M25562 Pain in left knee: Secondary | ICD-10-CM

## 2017-05-21 DIAGNOSIS — M7989 Other specified soft tissue disorders: Secondary | ICD-10-CM

## 2017-05-21 DIAGNOSIS — R030 Elevated blood-pressure reading, without diagnosis of hypertension: Secondary | ICD-10-CM | POA: Diagnosis not present

## 2017-05-21 NOTE — Progress Notes (Signed)
Pt was called with results and msg was left negative for DVT.  Please attempt to call again and make sure aware of results that were negative to start knee exercises, NSAIDs, ice, rest and if not improving need to see sports medicine.

## 2017-05-21 NOTE — Patient Instructions (Signed)
Meniscus Tear, Phase I Rehab After Surgery Ask your health care provider which exercises are safe for you. Do exercises exactly as told by your health care provider and adjust them as directed. It is normal to feel mild stretching, pulling, tightness, or discomfort as you do these exercises, but you should stop right away if you feel sudden pain or your pain gets worse.Do not begin these exercises until told by your health care provider. If told by your health care provider, wear your brace while you do these exercises. Stretching and range of motion exercises These exercises warm up your muscles and joints and improve the movement and flexibility of your knee. These exercises also help to relieve pain and stiffness. Exercise A: Knee flexion, passive (supine)  1. Start this exercise in one of these positions: ? Lying on the floor in front of an open doorway, with your left / right heel and foot lightly touching the wall. ? Lying on a bed with both of your feet on the wall or headboard. 2. Without using any effort, allow gravity to slide your foot down the wall slowly until you feel a gentle stretch in the front of your left / right knee, or until your knee reaches the angle that your health care provider tells you. 3. Hold this stretch for __________ seconds. 4. Return your leg to the starting position, using your healthy leg to do the work or to help if needed. Repeat __________ times. Complete this exercise __________ times a day. Exercise B: Knee extension, passive (sitting)  1. Sit with your left / right heel propped up on a chair, a coffee table, or a footstool. Do not have anything under your knee to support it. 2. Allow your leg muscles to relax, letting gravity straighten out your knee. You should feel a stretch behind your left / right knee. 3. If told by your health care provider, deepen the stretch by placing a __________ weight on your thigh, just above your kneecap. 4. Hold this  position for __________ seconds. Repeat __________ times. Complete this exercise __________ times a day. Exercise C: Gastrocnemius stretch, passive  1. Sit on the floor with your left / right leg extended. 2. Loop a belt or towel around the ball of your left / right foot. The ball of your foot is on the walking surface, right under your toes. 3. Keep your left / right ankle and foot relaxed and keep your knee straight while you use the belt or towel to pull your foot and ankle toward you. You should feel a gentle stretch in your calf or behind your knee. 4. Hold this position for __________ seconds. Repeat __________ times. Complete this exercise __________ times a day. Strengthening exercises These exercises build strength and endurance in your knee. Endurance is the ability to use your muscles for a long time, even after they get tired. Exercise D: Quadriceps, isometric  1. Lie on your back with your left / right leg extended and your opposite knee bent. 2. Slowly tense the muscles in the front of your left / right thigh. You should see your kneecap slide up toward your hip or see increased dimpling just above the knee. This motion will push the back of the knee toward the ground. 3. Hold the muscle as tight as you can without increasing your pain for __________ seconds. 4. Relax the muscles slowly and completely. Repeat __________ times. Complete this exercise __________ times a day. Exercise E: Straight leg raises (   quadriceps) 1. Lie on your back with your left / right leg extended and your other knee bent. 2. Tense the muscles in the front of your left / right thigh. 3. Keep these muscles tight as you raise your leg 4-6 inches (10-15 cm) off the floor. Do not let your knee bend. 4. Hold for __________ seconds. 5. Keep these muscles tense as you lower your leg. 6. Relax these muscles slowly and completely. Repeat __________ times. Complete this exercise __________ times a  day. Exercise F: Straight leg raises ( hip abductors) 1. Lie on your side with your left / right leg in the top position. Lie so your head, shoulder, knee, and hip line up. You may bend your lower knee to help with your balance. 2. Lift your top leg 4-6 inches (10-15 cm), keeping your toes pointed straight ahead. 3. Hold this position for __________ seconds. 4. Slowly lower your leg to the starting position. 5. Allow your muscles to relax completely. Repeat __________ times. Complete this exercise __________ times a day. This information is not intended to replace advice given to you by your health care provider. Make sure you discuss any questions you have with your health care provider. Document Released: 05/08/2016 Document Revised: 05/10/2016 Elsevier Interactive Patient Education  2017 Elsevier Inc.  

## 2017-05-21 NOTE — Progress Notes (Signed)
   Subjective:    Patient ID: Deborah Carrillo, female    DOB: Apr 12, 1981, 36 y.o.   MRN: [id number]  HPI  Pt is a 36 yo morbidly obese female who presents to the clinic with left knee pain and radiation of pain and numbness down into her left toes. She hurt her knees about 4 weeks ago. She was trying to get out of her car and felt left knee twist. She figured it would go away and just kept taking ibuprofen. Pain has not gone a way and now with the new symptoms radiating down leg it makes her nervous. She is still able to bear weight but she feels twinges of pain. She also feels like her leg is slightly more swollen. Pt denies any SOB, wheezing. She is on OCP.   .. Active Ambulatory Problems    Diagnosis Date Noted  . AMENORRHEA 09/15/2010  . FEMALE INFERTILITY 09/15/2010  . Obesity 11/23/2011  . PCOS (polycystic ovarian syndrome) 11/23/2011  . Diabetes type 2, controlled (HCC) 11/23/2011  . Essential hypertension 09/24/2012  . Tobacco abuse 01/14/2014  . Obesity, morbid, BMI 50 or higher (HCC) 02/11/2015  . Hyperlipidemia 12/28/2016   Resolved Ambulatory Problems    Diagnosis Date Noted  . No Resolved Ambulatory Problems   Past Medical History:  Diagnosis Date  . Essential hypertension 09/24/2012  . Hyperlipidemia 12/28/2016  . Polycystic ovary disease   . Tobacco abuse       Review of Systems See HPI.     Objective:   Physical Exam  Constitutional: She is oriented to person, place, and time. She appears well-developed and well-nourished.  Morbidly obese.   HENT:  Head: Normocephalic and atraumatic.  Eyes: Conjunctivae are normal.  Cardiovascular: Normal rate, regular rhythm and normal heart sounds.   Pulmonary/Chest: Effort normal and breath sounds normal.  Musculoskeletal:  Left knee:  ROM limited due to body habitus.  Appears to be some mild swelling in left knee.  No warmth, redness in left leg.  Multiple varicose veins in left leg.  Tenderness with mcmurrays.  No  laxity noted.  Negative homans.   Neurological: She is alert and oriented to person, place, and time.  Psychiatric: She has a normal mood and affect. Her behavior is normal.          Assessment & Plan:  Marland Kitchen.Marland Kitchen.Joni Reiningicole was seen today for left knee pain.  Diagnoses and all orders for this visit:  Acute pain of left knee -     US Venous Img Lower Unilateral Left; Future -     US Venous Img Lower Unilateral Left  Left leg swelling -     US Venous Img Lower Unilateral Left; Future -     US Venous Img Lower Unilateral Left  Elevated blood pressure reading    .Marland Kitchen. [facility] 05/21/2017 12/25/2016  Decreased Interest 0 1  Down, Depressed, Hopeless 0 1  PHQ - 2 Score 0 2   BP could be elevated due to her 3 5 hour energy drinks. Please follow up with PCP.   Concern for DVT vs mensical tear vis knee strain. PE was difficult due to body habitus.  Negative homans sign. Pt is obese and on OCP with knee injury.  U/S ordered and negative for DVT.  Discussed with patient REST, ICE, COMPRESSION, Elevation and NSAID. If no improve see sports medicine for injection and possible MRI to evaluate for mensical tear.

## 2017-05-24 ENCOUNTER — Ambulatory Visit: Payer: Managed Care, Other (non HMO) | Admitting: Physician Assistant

## 2017-05-28 ENCOUNTER — Telehealth: Payer: Self-pay | Admitting: Family Medicine

## 2017-05-28 NOTE — Telephone Encounter (Signed)
Please  call patient: Overdue for diabetes follow-up. Please have her schedule. Please also asked her if she has been for an eye exam this year and if she has let's get that information.

## 2017-05-29 NOTE — Telephone Encounter (Signed)
I called pt as you requested and left her message stating she is overdue for her Diabetic F/u appt and to call our office to schedule as soon as possible, I also seen where Amber had noted pt has not had a diabetic eye exam In years but I also asked her for that info on her voicemail. Thanks

## 2017-06-18 ENCOUNTER — Other Ambulatory Visit: Payer: Self-pay | Admitting: Family Medicine

## 2017-06-21 ENCOUNTER — Ambulatory Visit (INDEPENDENT_AMBULATORY_CARE_PROVIDER_SITE_OTHER): Payer: Managed Care, Other (non HMO)

## 2017-06-21 ENCOUNTER — Encounter: Payer: Self-pay | Admitting: Sports Medicine

## 2017-06-21 ENCOUNTER — Ambulatory Visit (INDEPENDENT_AMBULATORY_CARE_PROVIDER_SITE_OTHER): Payer: Managed Care, Other (non HMO) | Admitting: Sports Medicine

## 2017-06-21 DIAGNOSIS — M1712 Unilateral primary osteoarthritis, left knee: Secondary | ICD-10-CM

## 2017-06-21 DIAGNOSIS — M170 Bilateral primary osteoarthritis of knee: Secondary | ICD-10-CM | POA: Diagnosis not present

## 2017-06-21 HISTORY — DX: Unilateral primary osteoarthritis, left knee: M17.12

## 2017-06-21 MED ORDER — MELOXICAM 15 MG PO TABS: ORAL_TABLET | ORAL | 3 refills | Status: DC

## 2017-06-21 NOTE — Progress Notes (Signed)
   Subjective:    I'm seeing this patient as a consultation for:  Dr. Nani Gasser, Tandy Gaw, PA-C  CC: Left knee pain  HPI: This is a pleasant 36 year old., For the past several months she's had increasing knee pain, anterior and medial with occasional popping sensations. Has tried ibuprofen, Aleve, no improvement. Moderate, persistent.  Past medical history, Surgical history, Family history not pertinant except as noted below, Social history, Allergies, and medications have been entered into the medical record, reviewed, and no changes needed.   Review of Systems: No headache, visual changes, nausea, vomiting, diarrhea, constipation, dizziness, abdominal pain, skin rash, fevers, chills, night sweats, weight loss, swollen lymph nodes, body aches, joint swelling, muscle aches, chest pain, shortness of breath, mood changes, visual or auditory hallucinations.   Objective:   General: Well Developed, well nourished, and in no acute distress.  Neuro:  Extra-ocular muscles intact, able to move all 4 extremities, sensation grossly intact.  Deep tendon reflexes tested were normal. Psych: Alert and oriented, mood congruent with affect. ENT:  Ears and nose appear unremarkable.  Hearing grossly normal. Neck: Unremarkable overall appearance, trachea midline.  No visible thyroid enlargement. Eyes: Conjunctivae and lids appear unremarkable.  Pupils equal and round. Skin: Warm and dry, no rashes noted.  Cardiovascular: Pulses palpable, no extremity edema. Left Knee: Normal to inspection with no erythema or effusion or obvious bony abnormalities. Morbidly obese but tenderness around the medial joint line ROM normal in flexion and extension and lower leg rotation. Ligaments with solid consistent endpoints including ACL, PCL, LCL, MCL. Negative Mcmurray's and provocative meniscal tests. Non painful patellar compression. Patellar and quadriceps tendons unremarkable. Hamstring and quadriceps  strength is normal.  Procedure: Real-time Ultrasound Guided Injection of left knee Device: GE Logiq E  Verbal informed consent obtained.  Time-out conducted.  Noted no overlying erythema, induration, or other signs of local infection.  Skin prepped in a sterile fashion.  Local anesthesia: Topical Ethyl chloride.  With sterile technique and under real time ultrasound guidance:  Noted effusion, using a 22-gauge spinal needle advanced into the joint and injected 1 mL kenalog 40, 2 mL lidocaine, 2 mL bupivacaine. Completed without difficulty  Pain immediately resolved suggesting accurate placement of the medication.  Advised to call if fevers/chills, erythema, induration, drainage, or persistent bleeding.  Images permanently stored and available for review in the ultrasound unit.  Impression: Technically successful ultrasound guided injection.  Impression and Recommendations:   This case required medical decision making of moderate complexity.  Primary osteoarthritis of left knee Failed conservative measures including NSAIDs. Left knee injection as above, x-rays, switching to a long-acting NSAID. I'm also referring her to bariatric surgery.  Return to see me in one month.  Obesity, morbid, BMI 50 or higher Morbid obesity with body mass index well over 50 with multiple comorbidities including osteoarthritis of the knees, diabetes, uncontrolled hypertension, hyperlipidemia. She is a good candidate for sleeve gastrectomy, referral to bariatric surgery.  ___________________________________________ Ihor Austin. Benjamin Stain, M.D., ABFM., CAQSM. Primary Care and Sports Medicine [facility]  Adjunct Instructor of Family Medicine  [employer]

## 2017-06-21 NOTE — Assessment & Plan Note (Signed)
Morbid obesity with body mass index well over 50 with multiple comorbidities including osteoarthritis of the knees, diabetes, uncontrolled hypertension, hyperlipidemia. She is a good candidate for sleeve gastrectomy, referral to bariatric surgery.

## 2017-06-21 NOTE — Assessment & Plan Note (Signed)
Failed conservative measures including NSAIDs. Left knee injection as above, x-rays, switching to a long-acting NSAID. I'm also referring her to bariatric surgery.  Return to see me in one month.

## 2017-07-12 ENCOUNTER — Other Ambulatory Visit: Payer: Self-pay | Admitting: Family Medicine

## 2017-07-23 ENCOUNTER — Ambulatory Visit: Payer: Managed Care, Other (non HMO) | Admitting: Sports Medicine

## 2017-08-13 ENCOUNTER — Other Ambulatory Visit: Payer: Self-pay | Admitting: Family Medicine

## 2017-08-28 ENCOUNTER — Other Ambulatory Visit: Payer: Self-pay | Admitting: Family Medicine

## 2017-10-02 ENCOUNTER — Telehealth: Payer: Self-pay | Admitting: Family Medicine

## 2017-10-02 NOTE — Telephone Encounter (Signed)
Called pt cell and left a message stating that she needs to call our office and schedule a f/u with Dr. Linford ArnoldMetheney

## 2017-10-08 ENCOUNTER — Other Ambulatory Visit: Payer: Self-pay | Admitting: Family Medicine

## 2017-10-20 ENCOUNTER — Other Ambulatory Visit: Payer: Self-pay | Admitting: Family Medicine

## 2017-12-02 ENCOUNTER — Ambulatory Visit: Payer: Managed Care, Other (non HMO) | Admitting: Physician Assistant

## 2017-12-02 ENCOUNTER — Other Ambulatory Visit: Payer: Self-pay | Admitting: Family Medicine

## 2017-12-02 ENCOUNTER — Encounter: Payer: Self-pay | Admitting: Physician Assistant

## 2017-12-02 VITALS — BP 172/98 | HR 73 | Temp 99.0°F | Ht 63.0 in | Wt 313.0 lb

## 2017-12-02 DIAGNOSIS — J310 Chronic rhinitis: Secondary | ICD-10-CM

## 2017-12-02 DIAGNOSIS — J329 Chronic sinusitis, unspecified: Secondary | ICD-10-CM | POA: Diagnosis not present

## 2017-12-02 DIAGNOSIS — I10 Essential (primary) hypertension: Secondary | ICD-10-CM

## 2017-12-02 DIAGNOSIS — R509 Fever, unspecified: Secondary | ICD-10-CM

## 2017-12-02 DIAGNOSIS — J029 Acute pharyngitis, unspecified: Secondary | ICD-10-CM | POA: Diagnosis not present

## 2017-12-02 LAB — POCT INFLUENZA A/B
INFLUENZA A, POC: NEGATIVE
INFLUENZA B, POC: NEGATIVE

## 2017-12-02 LAB — POCT RAPID STREP A (OFFICE): Rapid Strep A Screen: NEGATIVE

## 2017-12-02 MED ORDER — METOPROLOL TARTRATE 100 MG PO TABS: ORAL_TABLET | ORAL | 0 refills | Status: DC

## 2017-12-02 MED ORDER — IPRATROPIUM BROMIDE 0.06 % NA SOLN: 2.0000 | Freq: Four times a day (QID) | NASAL | 0 refills | Status: DC

## 2017-12-02 MED ORDER — AMLODIPINE BESYLATE 2.5 MG PO TABS: 2.5000 mg | ORAL_TABLET | Freq: Every day | ORAL | 0 refills | Status: DC

## 2017-12-02 NOTE — Progress Notes (Signed)
Subjective:    Patient ID: Deborah Carrillo, female    DOB: 03-13-1981, 37 y.o.   MRN: [id number]  HPI  Pt is a 37 yo obese female with HTN who presents to the clinic with nasal congestion/pressure, chills, ear pressure, cough for 4 days. 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 She has no had a fever. Denies any ST. Her chest was tight for a few days but denies any wheezing. She is taking cold and flu preparations OTC. Her BP has been uncontrolled now for a few months. She denies any vision changes, CP, or HA. She just took her metoprolol.   .. Active Ambulatory Problems    Diagnosis Date Noted  . AMENORRHEA 09/15/2010  . FEMALE INFERTILITY 09/15/2010  . PCOS (polycystic ovarian syndrome) 11/23/2011  . Diabetes type 2, controlled (HCC) 11/23/2011  . Essential hypertension 09/24/2012  . Tobacco abuse 01/14/2014  . Obesity, morbid, BMI 50 or higher (HCC) 02/11/2015  . Hyperlipidemia 12/28/2016  . Primary osteoarthritis of left knee 06/21/2017   Resolved Ambulatory Problems    Diagnosis Date Noted  . Obesity 11/23/2011   Past Medical History:  Diagnosis Date  . Essential hypertension 09/24/2012  . Hyperlipidemia 12/28/2016  . Polycystic ovary disease   . Primary osteoarthritis of left knee 06/21/2017  . Tobacco abuse       Review of Systems    see HPI.  Objective:   Physical Exam  Constitutional: She is oriented to person, place, and time. She appears well-developed and well-nourished.  Obese.   HENT:  Head: Normocephalic and atraumatic.  Right Ear: External ear normal.  Left Ear: External ear normal.  Nose: Nose normal.  Mouth/Throat: Oropharynx is clear and moist. No oropharyngeal exudate.  Tenderness to palpation around nasal bridge.  TM's clear.   Eyes: Conjunctivae are normal. Right eye exhibits no discharge. Left eye exhibits no discharge.  Neck: Normal range of motion. Neck supple.  Cardiovascular:  Regular rhythm and normal heart sounds.  Tachycardia.   Pulmonary/Chest: Effort normal and breath sounds normal.  Lymphadenopathy:    She has no cervical adenopathy.  Neurological: She is alert and oriented to person, place, and time.  Skin: Skin is dry.  Psychiatric: She has a normal mood and affect. Her behavior is normal.          Assessment & Plan:  Marland KitchenMarland KitchenDiagnoses and all orders for this visit:  Rhinosinusitis -     ipratropium (ATROVENT) 0.06 % nasal spray; Place 2 sprays into both nostrils 4 (four) times daily.  Sore throat -     POCT Influenza A/B -     POCT rapid strep A  Fever, unspecified fever cause -     POCT Influenza A/B -     POCT rapid strep A  Essential hypertension -     metoprolol tartrate (LOPRESSOR) 100 MG tablet; TAKE 1 TABLET BY MOUTH 2TIMES DAILY. -     amLODipine (NORVASC) 2.5 MG tablet; Take 1 tablet (2.5 mg total) by mouth daily.   .. Results for orders placed or performed in visit on 12/02/17  POCT Influenza A/B  Result Value Ref Range   Influenza A, POC Negative Negative   Influenza B, POC Negative Negative  POCT rapid strep A  Result Value Ref Range   Rapid Strep A Screen Negative Negative   Discussed likely viral URI. STOP using sudafed due to BP increase. Only use mucinex and tylenol.start  using atrovent given for some rhino sinusitis. At this point I do not think this bacterial. Rest and hydrate for another day. Note for work given. Consider humidifier.  If worsening or symptoms changing call and will consider abx in next few days.   BP very out of control. Likely some due to sudafed use. Stop sudafed. Added norvasc. Pt has hx of angioedema with ACE so decided to avoid ARB.

## 2017-12-02 NOTE — Patient Instructions (Signed)

## 2017-12-16 ENCOUNTER — Other Ambulatory Visit: Payer: Self-pay

## 2017-12-16 MED ORDER — CANAGLIFLOZIN-METFORMIN HCL ER 50-1000 MG PO TB24: 1.0000 | ORAL_TABLET | Freq: Every day | ORAL | 0 refills | Status: DC

## 2018-01-01 ENCOUNTER — Ambulatory Visit: Payer: Managed Care, Other (non HMO) | Admitting: Family Medicine

## 2018-01-01 VITALS — BP 148/84 | HR 64 | Ht 63.0 in | Wt 313.0 lb

## 2018-01-01 DIAGNOSIS — E118 Type 2 diabetes mellitus with unspecified complications: Secondary | ICD-10-CM

## 2018-01-01 DIAGNOSIS — I10 Essential (primary) hypertension: Secondary | ICD-10-CM | POA: Diagnosis not present

## 2018-01-01 DIAGNOSIS — Z6841 Body Mass Index (BMI) 40.0 and over, adult: Secondary | ICD-10-CM

## 2018-01-01 LAB — POCT UA - MICROALBUMIN
CREATININE, POC: 100 mg/dL
Microalbumin Ur, POC: 150 mg/L

## 2018-01-01 LAB — POCT GLYCOSYLATED HEMOGLOBIN (HGB A1C): Hemoglobin A1C: 6.1

## 2018-01-01 MED ORDER — AMLODIPINE BESYLATE 5 MG PO TABS: 5.0000 mg | ORAL_TABLET | Freq: Every day | ORAL | 0 refills | Status: DC

## 2018-01-01 MED ORDER — LIRAGLUTIDE 18 MG/3ML ~~LOC~~ SOPN: PEN_INJECTOR | SUBCUTANEOUS | 2 refills | Status: DC

## 2018-01-01 NOTE — Progress Notes (Signed)
Subjective:    CC: BP , DM   HPI:  Hypertension- Pt denies chest pain, SOB, dizziness, or heart palpitations.  Taking meds as directed w/o problems.  Denies medication side effects.  She is not specifically eating a low-salt diet but says overall she does not tend to eat a lot of salt.  She does not tend to cook with a lot of salt.  Diabetes - no hypoglycemic events. No wounds or sores that are not healing well. No increased thirst or urination. Checking glucose at home. Taking medications as prescribed without any side effects.  Morbid obesity-she actually went to a weight loss seminar with a friend and says she was actually seriously considering weight loss surgery and then checked into it with her health insurance and it is not a covered service.  She has just a little frustrated.  She walks 10,000 steps a day at work.  Past medical history, Surgical history, Family history not pertinant except as noted below, Social history, Allergies, and medications have been entered into the medical record, reviewed, and corrections made.   Review of Systems: No fevers, chills, night sweats, weight loss, chest pain, or shortness of breath.   Objective:    General: Well Developed, well nourished, and in no acute distress.  Neuro: Alert and oriented x3, extra-ocular muscles intact, sensation grossly intact.  HEENT: Normocephalic, atraumatic  Skin: Warm and dry, no rashes. Cardiac: Regular rate and rhythm, no murmurs rubs or gallops, no lower extremity edema.  Respiratory: Clear to auscultation bilaterally. Not using accessory muscles, speaking in full sentences.   Impression and Recommendations:    DM -well controlled.  Hemoglobin A1c of 6.1.  We discussed adding Victoza to see if would also help her lose weight.  We could always discontinue the Invokana if she is doing really well on just the Victoza.  Follow-up in 4-6 weeks.    HTN -uncontrolled.  Increase amlodipine to 5 mg daily.  Follow-up in  4-6 weeks. Discussed the importance of the DASH diet.  BMI 55 -I think should be a great candidate to put her on Victoza.  Would help control her diabetes as well as help with some weight loss. Showed her how to use the pen. She is willing to try it.

## 2018-01-01 NOTE — Patient Instructions (Signed)
DASH Eating Plan DASH stands for "Dietary Approaches to Stop Hypertension." The DASH eating plan is a healthy eating plan that has been shown to reduce high blood pressure (hypertension). It may also reduce your risk for type 2 diabetes, heart disease, and stroke. The DASH eating plan may also help with weight loss. What are tips for following this plan? General guidelines  Avoid eating more than 2,300 mg (milligrams) of salt (sodium) a day. If you have hypertension, you may need to reduce your sodium intake to 1,500 mg a day.  Limit alcohol intake to no more than 1 drink a day for nonpregnant women and 2 drinks a day for men. One drink equals 12 oz of beer, 5 oz of wine, or 1 oz of hard liquor.  Work with your health care provider to maintain a healthy body weight or to lose weight. Ask what an ideal weight is for you.  Get at least 30 minutes of exercise that causes your heart to beat faster (aerobic exercise) most days of the week. Activities may include walking, swimming, or biking.  Work with your health care provider or diet and nutrition specialist (dietitian) to adjust your eating plan to your individual calorie needs. Reading food labels  Check food labels for the amount of sodium per serving. Choose foods with less than 5 percent of the Daily Value of sodium. Generally, foods with less than 300 mg of sodium per serving fit into this eating plan.  To find whole grains, look for the word "whole" as the first word in the ingredient list. Shopping  Buy products labeled as "low-sodium" or "no salt added."  Buy fresh foods. Avoid canned foods and premade or frozen meals. Cooking  Avoid adding salt when cooking. Use salt-free seasonings or herbs instead of table salt or sea salt. Check with your health care provider or pharmacist before using salt substitutes.  Do not fry foods. Cook foods using healthy methods such as baking, boiling, grilling, and broiling instead.  Cook with  heart-healthy oils, such as olive, canola, soybean, or sunflower oil. Meal planning   Eat a balanced diet that includes: ? 5 or more servings of fruits and vegetables each day. At each meal, try to fill half of your plate with fruits and vegetables. ? Up to 6-8 servings of whole grains each day. ? Less than 6 oz of lean meat, poultry, or fish each day. A 3-oz serving of meat is about the same size as a deck of cards. One egg equals 1 oz. ? 2 servings of low-fat dairy each day. ? A serving of nuts, seeds, or beans 5 times each week. ? Heart-healthy fats. Healthy fats called Omega-3 fatty acids are found in foods such as flaxseeds and coldwater fish, like sardines, salmon, and mackerel.  Limit how much you eat of the following: ? Canned or prepackaged foods. ? Food that is high in trans fat, such as fried foods. ? Food that is high in saturated fat, such as fatty meat. ? Sweets, desserts, sugary drinks, and other foods with added sugar. ? Full-fat dairy products.  Do not salt foods before eating.  Try to eat at least 2 vegetarian meals each week.  Eat more home-cooked food and less restaurant, buffet, and fast food.  When eating at a restaurant, ask that your food be prepared with less salt or no salt, if possible. What foods are recommended? The items listed may not be a complete list. Talk with your dietitian about what   dietary choices are best for you. Grains Whole-grain or whole-wheat bread. Whole-grain or whole-wheat pasta. Brown rice. Oatmeal. Quinoa. Bulgur. Whole-grain and low-sodium cereals. Pita bread. Low-fat, low-sodium crackers. Whole-wheat flour tortillas. Vegetables Fresh or frozen vegetables (raw, steamed, roasted, or grilled). Low-sodium or reduced-sodium tomato and vegetable juice. Low-sodium or reduced-sodium tomato sauce and tomato paste. Low-sodium or reduced-sodium canned vegetables. Fruits All fresh, dried, or frozen fruit. Canned fruit in natural juice (without  added sugar). Meat and other protein foods Skinless chicken or turkey. Ground chicken or turkey. Pork with fat trimmed off. Fish and seafood. Egg whites. Dried beans, peas, or lentils. Unsalted nuts, nut butters, and seeds. Unsalted canned beans. Lean cuts of beef with fat trimmed off. Low-sodium, lean deli meat. Dairy Low-fat (1%) or fat-free (skim) milk. Fat-free, low-fat, or reduced-fat cheeses. Nonfat, low-sodium ricotta or cottage cheese. Low-fat or nonfat yogurt. Low-fat, low-sodium cheese. Fats and oils Soft margarine without trans fats. Vegetable oil. Low-fat, reduced-fat, or light mayonnaise and salad dressings (reduced-sodium). Canola, safflower, olive, soybean, and sunflower oils. Avocado. Seasoning and other foods Herbs. Spices. Seasoning mixes without salt. Unsalted popcorn and pretzels. Fat-free sweets. What foods are not recommended? The items listed may not be a complete list. Talk with your dietitian about what dietary choices are best for you. Grains Baked goods made with fat, such as croissants, muffins, or some breads. Dry pasta or rice meal packs. Vegetables Creamed or fried vegetables. Vegetables in a cheese sauce. Regular canned vegetables (not low-sodium or reduced-sodium). Regular canned tomato sauce and paste (not low-sodium or reduced-sodium). Regular tomato and vegetable juice (not low-sodium or reduced-sodium). Pickles. Olives. Fruits Canned fruit in a light or heavy syrup. Fried fruit. Fruit in cream or butter sauce. Meat and other protein foods Fatty cuts of meat. Ribs. Fried meat. Bacon. Sausage. Bologna and other processed lunch meats. Salami. Fatback. Hotdogs. Bratwurst. Salted nuts and seeds. Canned beans with added salt. Canned or smoked fish. Whole eggs or egg yolks. Chicken or turkey with skin. Dairy Whole or 2% milk, cream, and half-and-half. Whole or full-fat cream cheese. Whole-fat or sweetened yogurt. Full-fat cheese. Nondairy creamers. Whipped toppings.  Processed cheese and cheese spreads. Fats and oils Butter. Stick margarine. Lard. Shortening. Ghee. Bacon fat. Tropical oils, such as coconut, palm kernel, or palm oil. Seasoning and other foods Salted popcorn and pretzels. Onion salt, garlic salt, seasoned salt, table salt, and sea salt. Worcestershire sauce. Tartar sauce. Barbecue sauce. Teriyaki sauce. Soy sauce, including reduced-sodium. Steak sauce. Canned and packaged gravies. Fish sauce. Oyster sauce. Cocktail sauce. Horseradish that you find on the shelf. Ketchup. Mustard. Meat flavorings and tenderizers. Bouillon cubes. Hot sauce and Tabasco sauce. Premade or packaged marinades. Premade or packaged taco seasonings. Relishes. Regular salad dressings. Where to find more information:  National Heart, Lung, and Blood Institute: www.nhlbi.nih.gov  American Heart Association: www.heart.org Summary  The DASH eating plan is a healthy eating plan that has been shown to reduce high blood pressure (hypertension). It may also reduce your risk for type 2 diabetes, heart disease, and stroke.  With the DASH eating plan, you should limit salt (sodium) intake to 2,300 mg a day. If you have hypertension, you may need to reduce your sodium intake to 1,500 mg a day.  When on the DASH eating plan, aim to eat more fresh fruits and vegetables, whole grains, lean proteins, low-fat dairy, and heart-healthy fats.  Work with your health care provider or diet and nutrition specialist (dietitian) to adjust your eating plan to your individual   calorie needs. This information is not intended to replace advice given to you by your health care provider. Make sure you discuss any questions you have with your health care provider. Document Released: 08/23/2011 Document Revised: 08/27/2016 Document Reviewed: 08/27/2016 Elsevier Interactive Patient Education  2018 Elsevier Inc.  

## 2018-01-02 ENCOUNTER — Other Ambulatory Visit: Payer: Self-pay

## 2018-01-02 MED ORDER — PEN NEEDLES 31G X 8 MM MISC: 99 refills | Status: DC

## 2018-01-13 ENCOUNTER — Other Ambulatory Visit: Payer: Self-pay | Admitting: Family Medicine

## 2018-01-13 NOTE — Telephone Encounter (Signed)
Per OV on 01-01-18, "DM -well controlled.  Hemoglobin A1c of 6.1.  We discussed adding Victoza to see if would also help her lose weight.  We could always discontinue the Invokana if she is doing really well on just the Victoza.  Follow-up in 4-6 weeks."  Pt has OV scheduled for 02-12-18 to follow up. Will send in 30 day supply

## 2018-01-14 ENCOUNTER — Telehealth: Payer: Self-pay | Admitting: Family Medicine

## 2018-01-14 MED ORDER — EMPAGLIFLOZIN-METFORMIN HCL 5-1000 MG PO TABS: 1.0000 | ORAL_TABLET | Freq: Two times a day (BID) | ORAL | 3 refills | Status: DC

## 2018-01-14 NOTE — Telephone Encounter (Signed)
Left pt msg to call about change in meds

## 2018-01-14 NOTE — Telephone Encounter (Signed)
Spoke with patient concerning med change. Pt stated she was on a coupon where she received this for free, so I advised her I would check to make sure this was not an error with the pharmacy.   Spoke to [employer] at [facility] and he states that pt's coupon code had a "max" and it was not longer free, new copay for Invokamet would be $56.55 for a 30 day supply, but new med called in was only a $15 co-pay for 30 days.  Called and advised pt of prices and she states she will go pick up new med since it was cheaper.

## 2018-01-14 NOTE — Telephone Encounter (Signed)
Please call patient: We received notification from [facility] that her invoke a met will not be covered.  I sent over prescription for Jardiance combined with metformin which is very similar.

## 2018-02-12 ENCOUNTER — Encounter: Payer: Self-pay | Admitting: Family Medicine

## 2018-02-12 ENCOUNTER — Ambulatory Visit: Payer: Managed Care, Other (non HMO) | Admitting: Family Medicine

## 2018-02-12 VITALS — BP 136/76 | HR 65 | Ht 63.0 in | Wt 311.0 lb

## 2018-02-12 DIAGNOSIS — E118 Type 2 diabetes mellitus with unspecified complications: Secondary | ICD-10-CM | POA: Diagnosis not present

## 2018-02-12 DIAGNOSIS — Z720 Tobacco use: Secondary | ICD-10-CM | POA: Diagnosis not present

## 2018-02-12 DIAGNOSIS — Z6841 Body Mass Index (BMI) 40.0 and over, adult: Secondary | ICD-10-CM

## 2018-02-12 MED ORDER — EMPAGLIFLOZIN-METFORMIN HCL 5-1000 MG PO TABS: 1.0000 | ORAL_TABLET | Freq: Two times a day (BID) | ORAL | 3 refills | Status: DC

## 2018-02-12 MED ORDER — BUPROPION HCL ER (XL) 150 MG PO TB24: 150.0000 mg | ORAL_TABLET | ORAL | 2 refills | Status: DC

## 2018-02-12 MED ORDER — SEMAGLUTIDE (1 MG/DOSE) 2 MG/1.5ML ~~LOC~~ SOPN: 0.2500 mg | PEN_INJECTOR | SUBCUTANEOUS | 0 refills | Status: DC

## 2018-02-12 NOTE — Progress Notes (Signed)
Subjective:    Patient ID: Deborah Carrillo, female    DOB: 1981-06-23, 37 y.o.   MRN: [id number]  HPI Diabetes - no hypoglycemic events. No wounds or sores that are not healing well. No increased thirst or urination.   f/u victoza, pt would like to d/c due to bruising. her last dose was friday.she would like to restart oral medication.  The price of the invoke a met will not because her coupon card expired to be $50 a month but this Iva Boop is only can be $30 a month.  BMI 55 - she did lose 2 lbs in the last 4 weeks on the Victoza.  Also her blood pressure looks under better control today.  She also talked to her friend has been using Wellbutrin to help her quit smoking.  She is interested in possibly trying it.  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  And she will need to quit smoking before surgery.  Review of Systems  BP 136/76   Pulse 65   Ht '5\' 3"'$  (1.6 m)   Wt (!) 311 lb (141.1 kg)   SpO2 98%   BMI 55.09 kg/m     Allergies  Allergen Reactions  . Amoxicillin   . Erythromycin   . Lisinopril     Angioedema  . Penicillins     Past Medical History:  Diagnosis Date  . Essential hypertension 09/24/2012  . Hyperlipidemia 12/28/2016  . Polycystic ovary disease   . Primary osteoarthritis of left knee 06/21/2017  . Tobacco abuse     Past Surgical History:  Procedure Laterality Date  . CESAREAN SECTION  2001  . TYMPANOPLASTY  1988    Social History   Socioeconomic History  . Marital status: Married    Spouse name: Not on file  . Number of children: Not on file  . Years of education: Not on file  . Highest education level: Not on file  Occupational History  . Occupation: Licensed conveyancer: [employer]  Social Needs  . Financial resource strain: Not on file  . Food insecurity:   Worry: Not on file    Inability: Not on file  . Transportation needs:    Medical: Not on file    Non-medical: Not on file  Tobacco Use  . Smoking status: Current Every Day Smoker    Packs/day: 0.50    Years: 14.00    Pack years: 7.00    Types: Cigarettes    Last attempt to quit: 03/28/2011    Years since quitting: 6.8  . Smokeless tobacco: Never Used  Substance and Sexual Activity  . Alcohol use: No  . Drug use: No  . Sexual activity: Yes    Partners: Male  Lifestyle  . Physical activity:    Days per week: Not on file    Minutes per session: Not on file  . Stress: Not on file  Relationships  . Social connections:    Talks on phone: Not on file    Gets together: Not on file    Attends religious service: Not on file    Active member of club or organization: Not on file    Attends meetings of clubs or organizations: Not on file    Relationship status: Not on file  .  Intimate partner violence:    Fear of current or ex partner: Not on file    Emotionally abused: Not on file    Physically abused: Not on file    Forced sexual activity: Not on file  Other Topics Concern  . Not on file  Social History Narrative  . Not on file    Family History  Problem Relation Age of Onset  . Diabetes Maternal Grandmother   . Hypothyroidism Mother   . Lung cancer Mother   . Diabetes Mother     Outpatient Encounter Medications as of 02/12/2018  Medication Sig  . amLODipine (NORVASC) 5 MG tablet Take 1 tablet (5 mg total) by mouth daily.  . metoprolol tartrate (LOPRESSOR) 100 MG tablet TAKE 1 TABLET BY MOUTH 2TIMES DAILY.  Marland Kitchen Norgestimate-Ethinyl Estradiol Triphasic 0.18/0.215/0.25 MG-35 MCG tablet TAKE 1 TABLET BY MOUTH DAILY.  Marland Kitchen buPROPion (WELLBUTRIN XL) 150 MG 24 hr tablet Take 1 tablet (150 mg total) by mouth every morning.  . Empagliflozin-metFORMIN HCl 01-999 MG TABS Take 1 tablet by mouth 2 (two) times daily.  . Semaglutide (OZEMPIC) 1 MG/DOSE SOPN Inject 0.25 mg into the skin  every 7 (seven) days.  . [DISCONTINUED] Empagliflozin-metFORMIN HCl 01-999 MG TABS Take 1 tablet by mouth 2 (two) times daily.  . [DISCONTINUED] Insulin Pen Needle (PEN NEEDLES) 31G X 8 MM MISC Inject into skin daily. Diagnosis DM E11.9  . [DISCONTINUED] liraglutide (VICTOZA) 18 MG/3ML SOPN 0.6 mg  QD x 7 days, then 1.2 mg SQ QD x 7 days, then increase to 1.8 mg QD  . [DISCONTINUED] misoprostol (CYTOTEC) 200 MCG tablet 4 tablets per vagina one time.   No facility-administered encounter medications on file as of 02/12/2018.          Objective:   Physical Exam  Constitutional: She is oriented to person, place, and time. She appears well-developed and well-nourished.  HENT:  Head: Normocephalic and atraumatic.  Cardiovascular: Normal rate, regular rhythm and normal heart sounds.  Pulmonary/Chest: Effort normal and breath sounds normal.  Neurological: She is alert and oriented to person, place, and time.  Skin: Skin is warm and dry.  Psychiatric: She has a normal mood and affect. Her behavior is normal.          Assessment & Plan:  Diabetes-discussed options.  Her last hemoglobin A1c really looked great.  We will try Ozempic in place of the Victoza.  He was [place].  New prescription sent to pharmacy.  BMI 55-discussed options.  I do think the Ozempic would be helpful for her.  In addition of Wellbutrin may also help curb her appetite especially while trying to quit smoking.  Tobacco abuse-discussed trial of Wellbutrin.  I think she will do well on it.  Scription sent to pharmacy.  Follow-up in 4 weeks.

## 2018-03-12 ENCOUNTER — Other Ambulatory Visit: Payer: Self-pay | Admitting: Family Medicine

## 2018-03-12 DIAGNOSIS — I10 Essential (primary) hypertension: Secondary | ICD-10-CM

## 2018-03-13 ENCOUNTER — Ambulatory Visit: Payer: Managed Care, Other (non HMO) | Admitting: Family Medicine

## 2018-03-13 ENCOUNTER — Encounter: Payer: Self-pay | Admitting: Family Medicine

## 2018-03-13 VITALS — BP 130/68 | HR 74 | Ht 63.0 in | Wt 303.0 lb

## 2018-03-13 DIAGNOSIS — E119 Type 2 diabetes mellitus without complications: Secondary | ICD-10-CM | POA: Diagnosis not present

## 2018-03-13 DIAGNOSIS — Z720 Tobacco use: Secondary | ICD-10-CM

## 2018-03-13 MED ORDER — NORGESTIM-ETH ESTRAD TRIPHASIC 0.18/0.215/0.25 MG-35 MCG PO TABS: 1.0000 | ORAL_TABLET | Freq: Every day | ORAL | 3 refills | Status: DC

## 2018-03-13 MED ORDER — SEMAGLUTIDE (1 MG/DOSE) 2 MG/1.5ML ~~LOC~~ SOPN
1.0000 mg | PEN_INJECTOR | SUBCUTANEOUS | 0 refills | Status: AC
Start: 2018-03-13 — End: 2018-04-12

## 2018-03-13 MED ORDER — BUPROPION HCL ER (XL) 150 MG PO TB24: 150.0000 mg | ORAL_TABLET | ORAL | 2 refills | Status: DC

## 2018-03-13 NOTE — Patient Instructions (Signed)
Current weight: 303 lbs Previous weight: 311 lbs Change in weight: down 8 lbs.  Goal weight: 250 lbs Dietary goals: cutting back on salt and fried foods.  Nutritional goals: try to walk for 10 minutes every other day.  Medication: Ozempic and Wellbutrin Follow-up and referrals: 3 months.

## 2018-03-13 NOTE — Progress Notes (Signed)
Subjective:    Patient ID: Deborah Carrillo, female    DOB: May 29, 1981, 37 y.o.   MRN: [id number]  HPI Follow-up abnormal weight gain-we had discussed switching to Ozempic for her diabetes to see if we can maximize potential for weight loss.  In addition we decided to add Wellbutrin in part to help her quit smoking but also again to help possibly curb appetite and help with emotional eating particularly while she is trying to quit smoking.  Tobacco abuse-  we decided to add Wellbutrin in part to help her quit smoking but also again to help possibly curb appetite and help with emotional eating particularly while she is trying to quit smoking.    DM-change to Ozempic. She is dong well on it. Feels dizzy on Day 1 of the injection and then has felt fine.  She is taking 1mg  dose.     Review of Systems  BP 130/68   Pulse 74   Ht 5\' 3"  (1.6 m)   Wt (!) 303 lb (137.4 kg)   SpO2 99%   BMI 53.67 kg/m     Allergies  Allergen Reactions  . Amoxicillin   . Erythromycin   . Lisinopril     Angioedema  . Penicillins     Past Medical History:  Diagnosis Date  . Essential hypertension 09/24/2012  . Hyperlipidemia 12/28/2016  . Polycystic ovary disease   . Primary osteoarthritis of left knee 06/21/2017  . Tobacco abuse     Past Surgical History:  Procedure Laterality Date  . CESAREAN SECTION  2001  . TYMPANOPLASTY  1988    Social History   Socioeconomic History  . Marital status: Married    Spouse name: Not on file  . Number of children: Not on file  . Years of education: Not on file  . Highest education level: Not on file  Occupational History  . Occupation: Merchant navy officer: [employer]  Social Needs  . Financial resource strain: Not on file  . Food insecurity:    Worry: Not on file    Inability: Not on file  . Transportation needs:    Medical: Not on file    Non-medical: Not on file  Tobacco Use  . Smoking status: Former Smoker    Packs/day: 0.50   Years: 14.00    Pack years: 7.00    Types: Cigarettes    Last attempt to quit: 03/03/2018    Years since quitting: 0.0  . Smokeless tobacco: Never Used  Substance and Sexual Activity  . Alcohol use: No  . Drug use: No  . Sexual activity: Yes    Partners: Male  Lifestyle  . Physical activity:    Days per week: Not on file    Minutes per session: Not on file  . Stress: Not on file  Relationships  . Social connections:    Talks on phone: Not on file    Gets together: Not on file    Attends religious service: Not on file    Active member of club or organization: Not on file    Attends meetings of clubs or organizations: Not on file    Relationship status: Not on file  . Intimate partner violence:    Fear of current or ex partner: Not on file    Emotionally abused: Not on file    Physically abused: Not on file    Forced sexual activity: Not on file  Other Topics Concern  . Not on file  Social History Narrative  . Not on file    Family History  Problem Relation Age of Onset  . Diabetes Maternal Grandmother   . Hypothyroidism Mother   . Lung cancer Mother   . Diabetes Mother     Outpatient Encounter Medications as of 03/13/2018  Medication Sig  . amLODipine (NORVASC) 5 MG tablet Take 1 tablet (5 mg total) by mouth daily.  Marland Kitchen. buPROPion (WELLBUTRIN XL) 150 MG 24 hr tablet Take 1 tablet (150 mg total) by mouth every morning.  . metoprolol tartrate (LOPRESSOR) 100 MG tablet Take 1 tablet (100 mg total) by mouth 2 (two) times daily.  . Norgestimate-Ethinyl Estradiol Triphasic 0.18/0.215/0.25 MG-35 MCG tablet Take 1 tablet by mouth daily.  . Semaglutide (OZEMPIC) 1 MG/DOSE SOPN Inject 1 mg into the skin every 7 (seven) days. 90 day supply  . [DISCONTINUED] buPROPion (WELLBUTRIN XL) 150 MG 24 hr tablet Take 1 tablet (150 mg total) by mouth every morning.  . [DISCONTINUED] Empagliflozin-metFORMIN HCl 01-999 MG TABS Take 1 tablet by mouth 2 (two) times daily.  . [DISCONTINUED]  metoprolol tartrate (LOPRESSOR) 100 MG tablet TAKE 1 TABLET BY MOUTH 2TIMES DAILY.  . [DISCONTINUED] misoprostol (CYTOTEC) 200 MCG tablet 4 tablets per vagina one time.  . [DISCONTINUED] Norgestimate-Ethinyl Estradiol Triphasic 0.18/0.215/0.25 MG-35 MCG tablet TAKE 1 TABLET BY MOUTH DAILY.  . [DISCONTINUED] Semaglutide (OZEMPIC) 1 MG/DOSE SOPN Inject 0.25 mg into the skin every 7 (seven) days.   No facility-administered encounter medications on file as of 03/13/2018.          Objective:   Physical Exam  Constitutional: She is oriented to person, place, and time. She appears well-developed and well-nourished.  HENT:  Head: Normocephalic and atraumatic.  Cardiovascular: Normal rate, regular rhythm and normal heart sounds.  Pulmonary/Chest: Effort normal and breath sounds normal.  Neurological: She is alert and oriented to person, place, and time.  Skin: Skin is warm and dry.  Psychiatric: She has a normal mood and affect. Her behavior is normal.        Assessment & Plan:  Weight gain/BMI greater than 50-gust options.  She is actually really doing fantastic she is actually up to 1 mg I will send over new prescription for 90-day supply since she will be without insurance for 90 days as she is changing jobs.  With her new job bariatric surgery will actually be covered so she is very excited and is hoping to get that done [place] be early next year.  She has noticed that the exam pick helps reduce her need to snack and has been doing better with that.  She has been trying to cut back on salty and fried foods.  She is trying to stay active and not sit for long periods of time but is not actually exercising currently.  Current weight: 303 lbs Previous weight: 311 lbs Change in weight: down 8 lbs.  Goal weight: 250 lbs Dietary goals: cutting back on salt and fried foods.  Nutritional goals: try to walk for 10 minutes every other day.  Medication: Ozempic and Wellbutrin Follow-up and referrals: 3  months.    Tobacco abuse- she has quit for 2  Weeks. Keep working at it.    Diabetes mellitus type 2 well-controlled- doing well on Ozempic.

## 2018-04-04 ENCOUNTER — Other Ambulatory Visit: Payer: Self-pay | Admitting: Family Medicine

## 2018-06-05 ENCOUNTER — Other Ambulatory Visit: Payer: Self-pay | Admitting: Family Medicine

## 2018-06-05 DIAGNOSIS — I10 Essential (primary) hypertension: Secondary | ICD-10-CM

## 2018-06-29 ENCOUNTER — Other Ambulatory Visit: Payer: Self-pay | Admitting: Family Medicine

## 2018-07-03 ENCOUNTER — Other Ambulatory Visit: Payer: Self-pay | Admitting: Sports Medicine

## 2018-07-03 DIAGNOSIS — M1712 Unilateral primary osteoarthritis, left knee: Secondary | ICD-10-CM

## 2018-07-17 ENCOUNTER — Other Ambulatory Visit: Payer: Self-pay | Admitting: Family Medicine

## 2018-07-17 DIAGNOSIS — I10 Essential (primary) hypertension: Secondary | ICD-10-CM

## 2018-09-17 HISTORY — PX: CHOLECYSTECTOMY: SHX55

## 2018-09-21 ENCOUNTER — Other Ambulatory Visit: Payer: Self-pay | Admitting: Physician Assistant

## 2018-09-22 NOTE — Telephone Encounter (Signed)
This refill request came to me, I saw that Dr. Linford Arnold is her PCP, so I thought I would route it to you for refill verification. Thank you!

## 2018-09-26 ENCOUNTER — Telehealth: Payer: Self-pay | Admitting: Family Medicine

## 2018-09-26 NOTE — Telephone Encounter (Signed)
Please call patient: Encourage her to please schedule an appointment.  She is past due for her six-month follow-up A1c.  Please see if she has had a flu shot so that we can document that as well and please remind her to schedule a yearly eye exam.  If she has already been then please let us know where she has gone so that we can request records.

## 2018-10-01 NOTE — Telephone Encounter (Signed)
Left VM with recommendation and callback information.

## 2018-10-03 NOTE — Telephone Encounter (Signed)
Pt has an appointment on 10/24/2018. She has not had her eye exam, flu shot given with her employer.Heath Gold, CMA

## 2018-10-23 ENCOUNTER — Ambulatory Visit: Payer: Managed Care, Other (non HMO) | Admitting: Family Medicine

## 2018-10-24 ENCOUNTER — Telehealth: Payer: Self-pay | Admitting: Family Medicine

## 2018-10-24 ENCOUNTER — Encounter: Payer: Self-pay | Admitting: Family Medicine

## 2018-10-24 ENCOUNTER — Ambulatory Visit: Payer: Managed Care, Other (non HMO) | Admitting: Family Medicine

## 2018-10-24 VITALS — BP 136/66 | HR 71 | Ht 63.0 in | Wt 303.0 lb

## 2018-10-24 DIAGNOSIS — I10 Essential (primary) hypertension: Secondary | ICD-10-CM

## 2018-10-24 DIAGNOSIS — E785 Hyperlipidemia, unspecified: Secondary | ICD-10-CM

## 2018-10-24 DIAGNOSIS — E119 Type 2 diabetes mellitus without complications: Secondary | ICD-10-CM | POA: Diagnosis not present

## 2018-10-24 DIAGNOSIS — Z23 Encounter for immunization: Secondary | ICD-10-CM | POA: Diagnosis not present

## 2018-10-24 LAB — POCT GLYCOSYLATED HEMOGLOBIN (HGB A1C): Hemoglobin A1C: 8.5 % — AB (ref 4.0–5.6)

## 2018-10-24 MED ORDER — METFORMIN HCL ER 500 MG PO TB24: 1000.0000 mg | ORAL_TABLET | Freq: Every day | ORAL | 2 refills | Status: DC

## 2018-10-24 MED ORDER — DULAGLUTIDE 0.75 MG/0.5ML ~~LOC~~ SOAJ: 0.7500 mg | SUBCUTANEOUS | 3 refills | Status: DC

## 2018-10-24 NOTE — Progress Notes (Signed)
Subjective:    CC: DM and BP   HPI:  Hypertension- Pt denies chest pain, SOB, dizziness, or heart palpitations.  Taking meds as directed w/o problems.  Denies medication side effects.    Diabetes - no hypoglycemic events. No wounds or sores that are not healing well. No increased thirst or urination. Checking glucose at home. Taking medications as prescribed without any side effects. She has been off her Ozempic since September.      F/U Hyperlipidemia - Not on a statin, though needs to be on one with her DM.  Due for lipid check.    Still not smoking. She is off the wellubtrin.   She is taking 5-hour energy tabs though which have caffeine.  Past medical history, Surgical history, Family history not pertinant except as noted below, Social history, Allergies, and medications have been entered into the medical record, reviewed, and corrections made.   Review of Systems: No fevers, chills, night sweats, weight loss, chest pain, or shortness of breath.   Objective:    General: Well Developed, well nourished, and in no acute distress.  Neuro: Alert and oriented x3, extra-ocular muscles intact, sensation grossly intact.  HEENT: Normocephalic, atraumatic  Skin: Warm and dry, no rashes. Cardiac: Regular rate and rhythm, no murmurs rubs or gallops, no lower extremity edema.  Respiratory: Clear to auscultation bilaterally. Not using accessory muscles, speaking in full sentences.   Impression and Recommendations:   HTN - Well controlled.  We will switch metoprolol to extended release since she often forgets to take her evening dose.  Continue current regimen. Follow up in  3 months.    DM - Uncontrolled. A1C of 8.5 today.  Discussed options.  She did well on Ozempic but does not think it is covered on her current insurance plan.  Recommend a trial of Trulicity.  New prescription sent to pharmacy.  Follow-up in 3 months.  This may also help with her goals for weight loss as well.  Hyperlipidemia  - due for labs.   She will go today.

## 2018-10-24 NOTE — Telephone Encounter (Signed)
Received fax from [employer] that Trulicity requires a PA. Information has been sent to the insurance company. Awaiting determination.   

## 2018-10-25 LAB — LIPID PANEL
CHOLESTEROL: 208 mg/dL — AB (ref <200)
HDL: 66 mg/dL (ref >=50)
LDL Cholesterol (Calc): 115 mg/dL (calc) — ABNORMAL HIGH
Non-HDL Cholesterol (Calc): 142 mg/dL (calc) — ABNORMAL HIGH (ref <130)
Total CHOL/HDL Ratio: 3.2 (calc) (ref <5.0)
Triglycerides: 155 mg/dL — ABNORMAL HIGH (ref <150)

## 2018-10-25 LAB — COMPLETE METABOLIC PANEL WITH GFR
AG RATIO: 1.1 (calc) (ref 1.0–2.5)
ALKALINE PHOSPHATASE (APISO): 82 U/L (ref 31–125)
ALT: 22 U/L (ref 6–29)
AST: 23 U/L (ref 10–30)
Albumin: 3.8 g/dL (ref 3.6–5.1)
BILIRUBIN TOTAL: 0.5 mg/dL (ref 0.2–1.2)
BUN: 10 mg/dL (ref 7–25)
CHLORIDE: 101 mmol/L (ref 98–110)
CO2: 28 mmol/L (ref 20–32)
Calcium: 9.3 mg/dL (ref 8.6–10.2)
Creat: 0.77 mg/dL (ref 0.50–1.10)
GFR, Est African American: 114 mL/min/{1.73_m2} (ref >=60)
GFR, Est Non African American: 98 mL/min/{1.73_m2} (ref >=60)
GLOBULIN: 3.5 g/dL (ref 1.9–3.7)
Glucose, Bld: 98 mg/dL (ref 65–99)
POTASSIUM: 4.2 mmol/L (ref 3.5–5.3)
SODIUM: 138 mmol/L (ref 135–146)
Total Protein: 7.3 g/dL (ref 6.1–8.1)

## 2018-10-25 LAB — TSH: TSH: 1.92 mIU/L

## 2018-11-03 NOTE — Telephone Encounter (Signed)
Received fax from [place] that Trulicity was approved from 10/24/2018 through 10/25/2019. Pharmacy notified and form sent to scan.   Reference ID: [id number]

## 2018-11-06 ENCOUNTER — Other Ambulatory Visit: Payer: Self-pay | Admitting: Sports Medicine

## 2018-11-06 DIAGNOSIS — M1712 Unilateral primary osteoarthritis, left knee: Secondary | ICD-10-CM

## 2018-11-15 ENCOUNTER — Other Ambulatory Visit: Payer: Self-pay | Admitting: Physician Assistant

## 2018-11-17 MED ORDER — METOPROLOL SUCCINATE ER 100 MG PO TB24: ORAL_TABLET | ORAL | 3 refills | Status: DC

## 2018-11-17 NOTE — Telephone Encounter (Signed)
I don't see on her med list.  Is she actually taking this.

## 2019-01-08 ENCOUNTER — Other Ambulatory Visit: Payer: Self-pay | Admitting: Family Medicine

## 2019-01-23 ENCOUNTER — Encounter: Payer: Self-pay | Admitting: Family Medicine

## 2019-01-23 ENCOUNTER — Ambulatory Visit (INDEPENDENT_AMBULATORY_CARE_PROVIDER_SITE_OTHER): Payer: Managed Care, Other (non HMO) | Admitting: Family Medicine

## 2019-01-23 VITALS — Ht 63.0 in | Wt 304.0 lb

## 2019-01-23 DIAGNOSIS — E119 Type 2 diabetes mellitus without complications: Secondary | ICD-10-CM | POA: Diagnosis not present

## 2019-01-23 DIAGNOSIS — R635 Abnormal weight gain: Secondary | ICD-10-CM

## 2019-01-23 DIAGNOSIS — M79672 Pain in left foot: Secondary | ICD-10-CM

## 2019-01-23 DIAGNOSIS — I10 Essential (primary) hypertension: Secondary | ICD-10-CM

## 2019-01-23 DIAGNOSIS — M79671 Pain in right foot: Secondary | ICD-10-CM | POA: Diagnosis not present

## 2019-01-23 DIAGNOSIS — M1712 Unilateral primary osteoarthritis, left knee: Secondary | ICD-10-CM

## 2019-01-23 MED ORDER — METOPROLOL SUCCINATE ER 100 MG PO TB24: ORAL_TABLET | ORAL | 1 refills | Status: DC

## 2019-01-23 MED ORDER — DULAGLUTIDE 1.5 MG/0.5ML ~~LOC~~ SOAJ: 1.5000 mg | SUBCUTANEOUS | 3 refills | Status: DC

## 2019-01-23 MED ORDER — MELOXICAM 15 MG PO TABS: ORAL_TABLET | ORAL | 3 refills | Status: DC

## 2019-01-23 MED ORDER — AMLODIPINE BESYLATE 5 MG PO TABS: 5.0000 mg | ORAL_TABLET | Freq: Every day | ORAL | 1 refills | Status: DC

## 2019-01-23 NOTE — Progress Notes (Signed)
Virtual Visit via Video Note  I connected with Kieli Fornes on 01/23/19 at  1:20 PM EDT by a video enabled telemedicine application and verified that I am speaking with the correct person using two identifiers.   I discussed the limitations of evaluation and management by telemedicine and the availability of in person appointments. The patient expressed understanding and agreed to proceed.  Subjective:    CC: F/U DM   HPI:  Diabetes - no hypoglycemic events. No wounds or sores that are not healing well. No increased thirst or urination. Checking glucose at home. Pt stopped taking metformin 2 months ago due to it causing issues with her going to the restroom.  She reports that she started taking the mobic again due to pain in her knees and plantar fascitis. Her heels have been hurting as well. She changed job positions and so has been walking a lot more.  Heel pain is more posterior.  She did try icing it but it did not really seem to help.  The meloxicam does help.  She is doing more walking but her weight has not gone down  Hypertension- Pt denies chest pain, SOB, dizziness, or heart palpitations.  Taking meds as directed w/o problems.  Denies medication side effects.      Past medical history, Surgical history, Family history not pertinant except as noted below, Social history, Allergies, and medications have been entered into the medical record, reviewed, and corrections made.   Review of Systems: No fevers, chills, night sweats, weight loss, chest pain, or shortness of breath.   Objective:    General: Speaking clearly in complete sentences without any shortness of breath.  Alert and oriented x3.  Normal judgment. No apparent acute distress.    Impression and Recommendations:   DM - will increase the trulicity to 1.5mg  since couldn't  Take the metformin.  Added to her intolerance list.  Would be due for repeat A1c as well as urine microalbumin she will go to the lab next week.   She usually has Fridays off so we will shoot for them.  Otherwise I will see her back in 3 months.  Abnormal weight gain - she has been walking more like her weight has actually plateaued.  We will be increasing her Trulicity more for diabetic control but that may help as well and when I see her back we can always discuss weight loss options.Marland Kitchen   HTN -  asymptomatic. No home BP cuff.  Follow-up in 3 months and will check it again at that time.  Knee pain and heel pain - ok to use the NSAID prn.  Ice as needed. Will refill the meloxiam. Can alternate w/ tylenol.  I will also send her handout for Achilles tendinitis to try stretches on her own at home.  Congratulated her on smoking cessation.  She actually quit on her birthday in June last year so she is a most at the one-year mark.  I discussed the assessment and treatment plan with the patient. The patient was provided an opportunity to ask questions and all were answered. The patient agreed with the plan and demonstrated an understanding of the instructions.   The patient was advised to call back or seek an in-person evaluation if the symptoms worsen or if the condition fails to improve as anticipated.   Nani Gasser, MD

## 2019-01-23 NOTE — Progress Notes (Signed)
Pt stopped taking metformin 2 months ago due to it causing issues with her going to the restroom.  She reports that she started taking the mobic again due to pain in her knees and plantar fascitis.   She is doing more walking but her weight has not gone down.Laureen Ochs, Viann Shove, CMA .

## 2019-02-09 ENCOUNTER — Other Ambulatory Visit: Payer: Self-pay | Admitting: Family Medicine

## 2019-03-19 LAB — HM DIABETES EYE EXAM

## 2019-04-17 ENCOUNTER — Encounter: Payer: Self-pay | Admitting: Family Medicine

## 2019-05-13 ENCOUNTER — Other Ambulatory Visit: Payer: Self-pay | Admitting: Physician Assistant

## 2019-06-15 ENCOUNTER — Encounter: Payer: Self-pay | Admitting: Family Medicine

## 2019-06-15 ENCOUNTER — Ambulatory Visit (INDEPENDENT_AMBULATORY_CARE_PROVIDER_SITE_OTHER): Payer: Managed Care, Other (non HMO) | Admitting: Family Medicine

## 2019-06-15 ENCOUNTER — Other Ambulatory Visit: Payer: Self-pay

## 2019-06-15 VITALS — BP 138/64 | HR 71 | Ht 63.0 in | Wt 238.0 lb

## 2019-06-15 DIAGNOSIS — Z23 Encounter for immunization: Secondary | ICD-10-CM

## 2019-06-15 DIAGNOSIS — R5383 Other fatigue: Secondary | ICD-10-CM

## 2019-06-15 DIAGNOSIS — F329 Major depressive disorder, single episode, unspecified: Secondary | ICD-10-CM | POA: Insufficient documentation

## 2019-06-15 DIAGNOSIS — R4589 Other symptoms and signs involving emotional state: Secondary | ICD-10-CM

## 2019-06-15 DIAGNOSIS — R630 Anorexia: Secondary | ICD-10-CM

## 2019-06-15 DIAGNOSIS — F32A Depression, unspecified: Secondary | ICD-10-CM

## 2019-06-15 DIAGNOSIS — Z6841 Body Mass Index (BMI) 40.0 and over, adult: Secondary | ICD-10-CM

## 2019-06-15 HISTORY — DX: Depression, unspecified: F32.A

## 2019-06-15 LAB — CBC
HCT: 38.1 % (ref 35.0–45.0)
Hemoglobin: 12.5 g/dL (ref 11.7–15.5)
MCH: 26.2 pg — ABNORMAL LOW (ref 27.0–33.0)
MCHC: 32.8 g/dL (ref 32.0–36.0)
MCV: 79.7 fL — ABNORMAL LOW (ref 80.0–100.0)
MPV: 11.1 fL (ref 7.5–12.5)
Platelets: 307 10*3/uL (ref 140–400)
RBC: 4.78 10*6/uL (ref 3.80–5.10)
RDW: 14.4 % (ref 11.0–15.0)
WBC: 8.7 10*3/uL (ref 3.8–10.8)

## 2019-06-15 LAB — ESTRADIOL: Estradiol: 24 pg/mL

## 2019-06-15 LAB — POCT URINE PREGNANCY: Preg Test, Ur: NEGATIVE

## 2019-06-15 LAB — FOLLICLE STIMULATING HORMONE: FSH: 5.3 m[IU]/mL

## 2019-06-15 LAB — LUTEINIZING HORMONE: LH: 1.8 m[IU]/mL

## 2019-06-15 LAB — PROGESTERONE: Progesterone: <0.5 ng/mL

## 2019-06-15 LAB — TSH: TSH: 1.72 mIU/L

## 2019-06-15 MED ORDER — ESCITALOPRAM OXALATE 10 MG PO TABS: ORAL_TABLET | ORAL | 1 refills | Status: DC

## 2019-06-15 MED ORDER — TRAZODONE HCL 50 MG PO TABS: 25.0000 mg | ORAL_TABLET | Freq: Every evening | ORAL | 1 refills | Status: DC | PRN

## 2019-06-15 NOTE — Assessment & Plan Note (Addendum)
BMI has dropped significantly compared to prior mostly because of weight loss secondary to decreased appetite. Down 66 lbs.

## 2019-06-15 NOTE — Progress Notes (Signed)
Acute Office Visit  Subjective:    Patient ID: Deborah Carrillo, female    DOB: 09-21-1980, 38 y.o.   MRN: [id number]  Chief Complaint  Patient presents with  . Follow-up    pt reports that she feels down    HPI Patient is in today low mood and not feeling like herself for months.  She says she just feels very tearful and very down she is not sure why she really has not had any major changes or stressors at work or at home.  She is also not sleeping well she has problems with chronic insomnia but says in the last 3 weeks it is been almost every night she has had difficulty and she has been relying on NyQuil and thek well to help her sleep.  She complains of little interest or pleasure doing anything more than half the days as well as decreased appetite.  She says most days she is only eating once a day and then when she does she does not even finish it.  She is lost a fair amount of weight because of it.  She also reports feeling like she is wearing a little too excessively its keeping her up.  She is planning on getting her tubes tied in October. She is currently on OCPs but she has been on the same pill for about 8 years with no recent change.   Past Medical History:  Diagnosis Date  . Acute depression 06/15/2019  . Essential hypertension 09/24/2012  . Hyperlipidemia 12/28/2016  . Polycystic ovary disease   . Primary osteoarthritis of left knee 06/21/2017  . Tobacco abuse     Past Surgical History:  Procedure Laterality Date  . CESAREAN SECTION  2001  . TYMPANOPLASTY  1988    Family History  Problem Relation Age of Onset  . Diabetes Maternal Grandmother   . Hypothyroidism Mother   . Lung cancer Mother   . Diabetes Mother     Social History   Socioeconomic History  . Marital status: Married    Spouse name: Not on file  . Number of children: Not on file  . Years of education: Not on file  . Highest education level: Not on file  Occupational History  . 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 Financial resource strain: Not on file  . Food insecurity    Worry: Not on file    Inability: Not on file  . Transportation needs    Medical: Not on file    Non-medical: Not on file  Tobacco Use  . Smoking status: Former Smoker    Packs/day: 0.50    Years: 14.00    Pack years: 7.00    Types: Cigarettes    Quit date: 03/03/2018    Years since quitting: 1.2  . Smokeless tobacco: Never Used  Substance and Sexual Activity  . Alcohol use: No  . Drug use: No  . Sexual activity: Yes    Partners: Male  Lifestyle  . Physical activity    Days per week: Not on file    Minutes per session: Not on file  . Stress: Not on file  Relationships  . Social Musicianconnections    Talks on phone: Not on file    Gets together: Not on file    Attends religious service: Not on file    Active member of club or organization: Not on file    Attends meetings of clubs or organizations:  Not on file    Relationship status: Not on file  . Intimate partner violence    Fear of current or ex partner: Not on file    Emotionally abused: Not on file    Physically abused: Not on file    Forced sexual activity: Not on file  Other Topics Concern  . Not on file  Social History Narrative  . Not on file    Outpatient Medications Prior to Visit  Medication Sig Dispense Refill  . amLODipine (NORVASC) 5 MG tablet Take 1 tablet (5 mg total) by mouth daily. 90 tablet 1  . meloxicam (MOBIC) 15 MG tablet TAKE ONE TABLET BY MOUTH EACH AM WITH BREAKFAST FOR 2 WEEKS, THEN DAILY AS NEEDED FOR PAIN 30 tablet 3  . metoprolol succinate (TOPROL-XL) 100 MG 24 hr tablet TAKE 1 TABLET (100 MG TOTAL) BY MOUTH DAILY. TAKE WITH OR IMMEDIATELY FOLLOWING A MEAL. 90 tablet 1  . TRI-PREVIFEM 0.18/0.215/0.25 MG-35 MCG tablet TAKE 1 TABLET BY MOUTH EVERY DAY 84 tablet 3  . TRULICITY 0.75 MG/0.5ML SOPN INJECT 0.75 MG INTO THE SKIN EVERY 7 (SEVEN) DAYS. 2 pen 3   No facility-administered  medications prior to visit.     Allergies  Allergen Reactions  . Lisinopril     Angioedema  . Metformin And Related Other (See Comments)    GI upset  . Victoza [Liraglutide] Other (See Comments)    Soreness and bruising  . Amoxicillin   . Erythromycin   . Penicillins     ROS     Objective:    Physical Exam  Constitutional: She is oriented to person, place, and time. She appears well-developed and well-nourished.  HENT:  Head: Normocephalic and atraumatic.  Cardiovascular: Normal rate, regular rhythm and normal heart sounds.  Pulmonary/Chest: Effort normal and breath sounds normal.  Neurological: She is alert and oriented to person, place, and time.  Skin: Skin is warm and dry.  Psychiatric: She has a normal mood and affect. Her behavior is normal.    BP 138/64   Pulse 71   Ht 5\' 3"  (1.6 m)   Wt 238 lb (108 kg)   SpO2 95%   BMI 42.16 kg/m  Wt Readings from Last 3 Encounters:  06/15/19 238 lb (108 kg)  01/23/19 (!) 304 lb (137.9 kg)  10/24/18 (!) 303 lb (137.4 kg)    Health Maintenance Due  Topic Date Due  . FOOT EXAM  01/02/2019  . URINE MICROALBUMIN  01/02/2019  . INFLUENZA VACCINE  04/18/2019  . HEMOGLOBIN A1C  04/24/2019    There are no preventive care reminders to display for this patient.   Lab Results  Component Value Date   TSH 1.92 10/24/2018   Lab Results  Component Value Date   WBC 7.3 12/07/2014   HGB 12.0 12/07/2014   HCT 36.5 12/07/2014   MCV 73.9 (L) 12/07/2014   PLT 258 12/07/2014   Lab Results  Component Value Date   NA 138 10/24/2018   K 4.2 10/24/2018   CO2 28 10/24/2018   GLUCOSE 98 10/24/2018   BUN 10 10/24/2018   CREATININE 0.77 10/24/2018   BILITOT 0.5 10/24/2018   ALKPHOS 66 12/25/2016   AST 23 10/24/2018   ALT 22 10/24/2018   PROT 7.3 10/24/2018   ALBUMIN 3.5 (L) 12/25/2016   CALCIUM 9.3 10/24/2018   Lab Results  Component Value Date   CHOL 208 (H) 10/24/2018   Lab Results  Component Value Date   HDL 66  10/24/2018   Lab Results  Component Value Date   LDLCALC 115 (H) 10/24/2018   Lab Results  Component Value Date   TRIG 155 (H) 10/24/2018   Lab Results  Component Value Date   CHOLHDL 3.2 10/24/2018   Lab Results  Component Value Date   HGBA1C 8.5 (A) 10/24/2018       Assessment & Plan:   Problem List Items Addressed This Visit      Other   BMI 40.0-44.9, adult ([place])    BMI has dropped significantly compared to prior mostly because of weight loss secondary to decreased appetite. Down 66 lbs.       Acute depression - Primary   Relevant Medications   escitalopram (LEXAPRO) 10 MG tablet   traZODone (DESYREL) 50 MG tablet   Other Relevant Orders   TSH   CBC   Estradiol   Follicle stimulating hormone   Luteinizing hormone   Progesterone    Other Visit Diagnoses    Fatigue, unspecified type       Relevant Orders   POCT urine pregnancy (Completed)   TSH   CBC   Estradiol   Follicle stimulating hormone   Luteinizing hormone   Progesterone   Tearfulness       Relevant Orders   TSH   CBC   Estradiol   Follicle stimulating hormone   Luteinizing hormone   Progesterone   Decreased appetite       Relevant Orders   TSH   CBC   Estradiol   Follicle stimulating hormone   Luteinizing hormone   Progesterone   Need for immunization against influenza       Relevant Orders   Flu Vaccine QUAD 36+ mos IM (Completed)     Acute depression/tearfulness.  She is worried something is going on with her.  She has lost a lot of weight.  We discussed that it is likely an imbalance of serotonin epinephrine in her brain.  Why all of a sudden it is not very clear she is not able to pinpoint any particular stressors.  Certainly her insomnia has ramped up so want to consider treating that as well.  Will check for thyroid disorder and hormonal disorder.  But she still been on the same birth control pill for the last 8 years and that has not changed recently.  I like to see her back in  4 weeks to see how she is doing.  Normally I would see her back sooner but she is not able to get off of work until the end of October to come in for an appointment.  She can certainly call if she has any problems in interim and warned about potential side effects.  We will ahead and start Lexapro.  Decreased appetite-we will check a CBC as well as a TSH.   Meds ordered this encounter  Medications  . escitalopram (LEXAPRO) 10 MG tablet    Sig: 1/2 tab po QD x 6 days then increase to whole tab daily.    Dispense:  30 tablet    Refill:  1  . traZODone (DESYREL) 50 MG tablet    Sig: Take 0.5-2 tablets (25-100 mg total) by mouth at bedtime as needed for sleep.    Dispense:  30 tablet    Refill:  1     Beatrice Lecher, MD

## 2019-06-22 ENCOUNTER — Other Ambulatory Visit: Payer: Self-pay | Admitting: Family Medicine

## 2019-06-22 NOTE — Telephone Encounter (Signed)
Prescribed 06/15/2019 #30 with one refill. She is to follow up in 4 weeks. She is requesting 90 day supply. Please advise.

## 2019-06-29 DIAGNOSIS — K810 Acute cholecystitis: Secondary | ICD-10-CM | POA: Insufficient documentation

## 2019-07-07 ENCOUNTER — Other Ambulatory Visit: Payer: Self-pay | Admitting: Family Medicine

## 2019-07-13 ENCOUNTER — Ambulatory Visit: Payer: Managed Care, Other (non HMO) | Admitting: Family Medicine

## 2019-07-26 ENCOUNTER — Other Ambulatory Visit: Payer: Self-pay | Admitting: Family Medicine

## 2019-07-26 DIAGNOSIS — I10 Essential (primary) hypertension: Secondary | ICD-10-CM

## 2019-09-03 ENCOUNTER — Other Ambulatory Visit: Payer: Self-pay | Admitting: Family Medicine

## 2019-09-21 ENCOUNTER — Other Ambulatory Visit: Payer: Self-pay | Admitting: Family Medicine

## 2019-09-29 ENCOUNTER — Other Ambulatory Visit: Payer: Self-pay | Admitting: Family Medicine

## 2019-10-07 ENCOUNTER — Other Ambulatory Visit: Payer: Self-pay | Admitting: *Deleted

## 2019-10-07 ENCOUNTER — Other Ambulatory Visit: Payer: Self-pay | Admitting: Family Medicine

## 2019-10-07 DIAGNOSIS — E119 Type 2 diabetes mellitus without complications: Secondary | ICD-10-CM

## 2019-10-21 ENCOUNTER — Other Ambulatory Visit: Payer: Self-pay | Admitting: Family Medicine

## 2019-10-21 DIAGNOSIS — M1712 Unilateral primary osteoarthritis, left knee: Secondary | ICD-10-CM

## 2019-11-06 ENCOUNTER — Telehealth: Payer: Self-pay | Admitting: Family Medicine

## 2019-11-06 ENCOUNTER — Other Ambulatory Visit: Payer: Self-pay | Admitting: Family Medicine

## 2019-11-06 NOTE — Telephone Encounter (Signed)
Please call patient and let her know that we need to get her scheduled for diabetic follow-up.  We have not done an A1c on her in a year.  It looks like it was ordered back in the spring but she never went.  Please get her scheduled ASAP.

## 2019-11-14 ENCOUNTER — Other Ambulatory Visit: Payer: Self-pay | Admitting: Family Medicine

## 2019-11-14 DIAGNOSIS — E119 Type 2 diabetes mellitus without complications: Secondary | ICD-10-CM

## 2019-11-23 ENCOUNTER — Other Ambulatory Visit: Payer: Self-pay | Admitting: Nurse Practitioner

## 2019-11-30 ENCOUNTER — Other Ambulatory Visit: Payer: Self-pay | Admitting: Family Medicine

## 2019-12-07 ENCOUNTER — Encounter: Payer: Managed Care, Other (non HMO) | Admitting: Family Medicine

## 2019-12-11 ENCOUNTER — Other Ambulatory Visit: Payer: Self-pay | Admitting: Family Medicine

## 2019-12-11 DIAGNOSIS — E119 Type 2 diabetes mellitus without complications: Secondary | ICD-10-CM

## 2019-12-14 ENCOUNTER — Encounter: Payer: Self-pay | Admitting: Family Medicine

## 2019-12-14 ENCOUNTER — Other Ambulatory Visit: Payer: Self-pay | Admitting: *Deleted

## 2019-12-14 ENCOUNTER — Other Ambulatory Visit: Payer: Self-pay | Admitting: Family Medicine

## 2019-12-14 ENCOUNTER — Ambulatory Visit (INDEPENDENT_AMBULATORY_CARE_PROVIDER_SITE_OTHER): Payer: Managed Care, Other (non HMO) | Admitting: Family Medicine

## 2019-12-14 ENCOUNTER — Other Ambulatory Visit (HOSPITAL_COMMUNITY)
Admission: RE | Admit: 2019-12-14 | Discharge: 2019-12-14 | Disposition: A | Discharge status: Home / Self Care | Payer: Managed Care, Other (non HMO) | Source: Ambulatory Visit | Attending: Family Medicine | Admitting: Family Medicine

## 2019-12-14 VITALS — BP 132/78 | HR 56 | Ht 63.0 in | Wt 279.0 lb

## 2019-12-14 DIAGNOSIS — Z0000 Encounter for general adult medical examination without abnormal findings: Secondary | ICD-10-CM | POA: Insufficient documentation

## 2019-12-14 DIAGNOSIS — Z124 Encounter for screening for malignant neoplasm of cervix: Secondary | ICD-10-CM

## 2019-12-14 MED ORDER — NORGESTIM-ETH ESTRAD TRIPHASIC 0.18/0.215/0.25 MG-35 MCG PO TABS: 1.0000 | ORAL_TABLET | Freq: Every day | ORAL | 11 refills | Status: DC

## 2019-12-14 NOTE — Progress Notes (Signed)
Subjective:     Deborah Carrillo is a 39 y.o. female and is here for a comprehensive physical exam. The patient reports no problems.  Has had a lot happen since she was last here.  She had emergency gallbladder surgery in the fall and she and her husband recently split.  Her son who is 8 was actually into car accidents this last year but he is actually doing okay.  Social History   Socioeconomic History  . Marital status: Legally Separated    Spouse name: Not on file  . Number of children: Not on file  . Years of education: Not on file  . Highest education level: Not on file  Occupational History  . Occupation: Licensed conveyancer: [employer]  Tobacco Use  . Smoking status: Former Smoker    Packs/day: 0.50    Years: 14.00    Pack years: 7.00    Types: Cigarettes    Quit date: 03/03/2018    Years since quitting: 1.7  . Smokeless tobacco: Never Used  Substance and Sexual Activity  . Alcohol use: No  . Drug use: No  . Sexual activity: Yes    Partners: Male  Other Topics Concern  . Not on file  Social History Narrative  . Not on file   Social Determinants of Health   Financial Resource Strain:   . Difficulty of Paying Living Expenses:   Food Insecurity:   . Worried About Charity fundraiser in the Last Year:   . Arboriculturist in the Last Year:   Transportation Needs:   . Film/video editor (Medical):   Marland Kitchen Lack of Transportation (Non-Medical):   Physical Activity:   . Days of Exercise per Week:   . Minutes of Exercise per Session:   Stress:   . Feeling of Stress :   Social Connections:   . Frequency of Communication with Friends and Family:   . Frequency of Social Gatherings with Friends and Family:   . Attends Religious Services:   . Active Member of Clubs or Organizations:   . Attends Archivist Meetings:   Marland Kitchen Marital Status:   Intimate Partner Violence:   . Fear of Current or Ex-Partner:   . Emotionally Abused:   Marland Kitchen Physically Abused:    . Sexually Abused:    Health Maintenance  Topic Date Due  . URINE MICROALBUMIN  01/02/2019  . HEMOGLOBIN A1C  04/24/2019  . FOOT EXAM  03/16/2020 (Originally 01/02/2019)  . OPHTHALMOLOGY EXAM  03/18/2020  . PAP SMEAR-Modifier  06/13/2020  . TETANUS/TDAP  08/22/2022  . INFLUENZA VACCINE  Completed  . PNEUMOCOCCAL POLYSACCHARIDE VACCINE AGE 90-64 HIGH RISK  Completed  . HIV Screening  Completed    The following portions of the patient's history were reviewed and updated as appropriate: allergies, current medications, past family history, past medical history, past social history, past surgical history and problem list.  Review of Systems A comprehensive review of systems was negative.   Objective:    BP 132/78   Pulse (!) 56   Ht 5\' 3"  (1.6 m)   Wt 279 lb (126.6 kg)   LMP 12/03/2019 (Approximate)   SpO2 100%   BMI 49.42 kg/m  General appearance: alert, cooperative and appears stated age Head: Normocephalic, without obvious abnormality, atraumatic Eyes: conj clear, EOMI, PEERLA Ears: normal TM's and external ear canals both ears Nose: Nares normal. Septum midline. Mucosa normal. No drainage or sinus tenderness. Throat: lips, mucosa, and  tongue normal; teeth and gums normal Neck: no adenopathy, no carotid bruit, no JVD, supple, symmetrical, trachea midline and thyroid not enlarged, symmetric, no tenderness/mass/nodules Back: symmetric, no curvature. ROM normal. No CVA tenderness. Lungs: clear to auscultation bilaterally Breasts: normal appearance, no masses or tenderness Heart: regular rate and rhythm, S1, S2 normal, no murmur, click, rub or gallop Abdomen: soft, non-tender; bowel sounds normal; no masses,  no organomegaly Pelvic: cervix normal in appearance, external genitalia normal, no adnexal masses or tenderness, no cervical motion tenderness, rectovaginal septum normal, uterus normal size, shape, and consistency and vagina normal without discharge Extremities: extremities  normal, atraumatic, no cyanosis or edema Pulses: 2+ and symmetric Skin: Skin color, texture, turgor normal. No rashes or lesions Lymph nodes: Cervical, supraclavicular, and axillary nodes normal. Neurologic: Alert and oriented X 3, normal strength and tone. Normal symmetric reflexes. Normal coordination and gait    Assessment:    Healthy female exam.      Plan:     See After Visit Summary for Counseling Recommendations   Keep up a regular exercise program and make sure you are eating a healthy diet Try to eat 4 servings of dairy a day, or if you are lactose intolerant take a calcium with vitamin D daily.  Your vaccines are up to date.  Due for diabetes follow-up as well.  Last A1c was most a year ago and it was uncontrolled at that time.  But she has done well and has lost some weight.  In fact her BMI is down to 49.  Up-to-date labs today.  Pap smear performed.  Will call with test results once available.  Declined STD testing even though she has had a new sexual partner.

## 2019-12-15 LAB — LIPID PANEL
Cholesterol: 202 mg/dL — ABNORMAL HIGH (ref <200)
HDL: 68 mg/dL (ref >=50)
LDL Cholesterol (Calc): 105 mg/dL (calc) — ABNORMAL HIGH
Non-HDL Cholesterol (Calc): 134 mg/dL (calc) — ABNORMAL HIGH (ref <130)
Total CHOL/HDL Ratio: 3 (calc) (ref <5.0)
Triglycerides: 169 mg/dL — ABNORMAL HIGH (ref <150)

## 2019-12-15 LAB — COMPLETE METABOLIC PANEL WITH GFR
AG Ratio: 1.1 (calc) (ref 1.0–2.5)
ALT: 18 U/L (ref 6–29)
AST: 16 U/L (ref 10–30)
Albumin: 3.6 g/dL (ref 3.6–5.1)
Alkaline phosphatase (APISO): 81 U/L (ref 31–125)
BUN: 9 mg/dL (ref 7–25)
CO2: 28 mmol/L (ref 20–32)
Calcium: 8.8 mg/dL (ref 8.6–10.2)
Chloride: 103 mmol/L (ref 98–110)
Creat: 0.82 mg/dL (ref 0.50–1.10)
GFR, Est African American: 104 mL/min/{1.73_m2} (ref >=60)
GFR, Est Non African American: 90 mL/min/{1.73_m2} (ref >=60)
Globulin: 3.2 g/dL (calc) (ref 1.9–3.7)
Glucose, Bld: 89 mg/dL (ref 65–99)
Potassium: 3.9 mmol/L (ref 3.5–5.3)
Sodium: 138 mmol/L (ref 135–146)
Total Bilirubin: 0.4 mg/dL (ref 0.2–1.2)
Total Protein: 6.8 g/dL (ref 6.1–8.1)

## 2019-12-15 LAB — HEMOGLOBIN A1C
Hgb A1c MFr Bld: 6 % of total Hgb — ABNORMAL HIGH (ref <5.7)
Mean Plasma Glucose: 126 (calc)
eAG (mmol/L): 7 (calc)

## 2019-12-15 LAB — CYTOLOGY - PAP
Comment: NEGATIVE
Diagnosis: NEGATIVE
High risk HPV: NEGATIVE

## 2019-12-15 LAB — MICROALBUMIN / CREATININE URINE RATIO
Creatinine, Urine: 146 mg/dL (ref 20–275)
Microalb Creat Ratio: 15 mcg/mg creat (ref <30)
Microalb, Ur: 2.2 mg/dL

## 2019-12-15 NOTE — Progress Notes (Signed)
Call patient: Your Pap smear is normal. Repeat in 5 years.

## 2020-01-01 ENCOUNTER — Other Ambulatory Visit: Payer: Self-pay | Admitting: Family Medicine

## 2020-01-01 DIAGNOSIS — E119 Type 2 diabetes mellitus without complications: Secondary | ICD-10-CM

## 2020-01-02 ENCOUNTER — Other Ambulatory Visit: Payer: Self-pay | Admitting: Family Medicine

## 2020-01-15 ENCOUNTER — Other Ambulatory Visit: Payer: Self-pay | Admitting: Family Medicine

## 2020-01-15 DIAGNOSIS — I10 Essential (primary) hypertension: Secondary | ICD-10-CM

## 2020-02-11 ENCOUNTER — Telehealth: Payer: Self-pay | Admitting: Family Medicine

## 2020-02-11 NOTE — Telephone Encounter (Signed)
Received fax for PA on Trulicity sent through cover my meds and received approval.   Case ID [id number] Valid: 11/17/2019 - 11/16/2021 - CF

## 2020-04-23 ENCOUNTER — Other Ambulatory Visit: Payer: Self-pay | Admitting: Family Medicine

## 2020-04-23 DIAGNOSIS — E119 Type 2 diabetes mellitus without complications: Secondary | ICD-10-CM

## 2020-05-22 ENCOUNTER — Other Ambulatory Visit: Payer: Self-pay | Admitting: Family Medicine

## 2020-05-22 DIAGNOSIS — E119 Type 2 diabetes mellitus without complications: Secondary | ICD-10-CM

## 2020-06-01 NOTE — Progress Notes (Signed)
Established Patient Office Visit  Subjective:  Patient ID: Deborah Carrillo, female    DOB: 05/27/1981  Age: 39 y.o. MRN: [id number]  CC:  Chief Complaint  Patient presents with  . Diabetes  . Hypertension   Due for diabetic eye exam and foot exam today.  HPI [name] presents for   Diabetes - no hypoglycemic events. No wounds or sores that are not healing well. No increased thirst or urination. Checking glucose at home. Taking medications as prescribed without any side effects. Note, she did run out of her Trulicity a little over a week ago.  Hypertension- Pt denies chest pain, SOB, dizziness, or heart palpitations.  Taking meds as directed w/o problems.  Denies medication side effects.    Broken vein in her right leg.  Occ discomfort.  She would like me to take a look at it today.  Also like to discuss her weight BMI is 50 and she would really like to get the weight off.  She has been trying to work at it and just does not feel like she is making a lot of headway.  She was hoping that the Trulicity would help with weight loss but unfortunately she feels like it has not.  Per our records it looks like she is actually gained 8 pounds over the last 6 months.  Past Medical History:  Diagnosis Date  . Acute depression 06/15/2019  . Essential hypertension 09/24/2012  . Hyperlipidemia 12/28/2016  . Polycystic ovary disease   . Primary osteoarthritis of left knee 06/21/2017  . Tobacco abuse     Past Surgical History:  Procedure Laterality Date  . CESAREAN SECTION  2001  . CHOLECYSTECTOMY  2020  . TYMPANOPLASTY  1988    Family History  Problem Relation Age of Onset  . Diabetes Maternal Grandmother   . Hypothyroidism Mother   . Lung cancer Mother   . Diabetes Mother     Social History   Socioeconomic History  . Marital status: Legally Separated    Spouse name: Not on file  . Number of children: Not on file  . Years of education: Not on file  . Highest education level:  Not on file  Occupational History  . Occupation: Merchant navy officer: [employer]  Tobacco Use  . Smoking status: Former Smoker    Packs/day: 0.50    Years: 14.00    Pack years: 7.00    Types: Cigarettes    Quit date: 03/03/2018    Years since quitting: 2.2  . Smokeless tobacco: Never Used  Substance and Sexual Activity  . Alcohol use: No  . Drug use: No  . Sexual activity: Yes    Partners: Male  Other Topics Concern  . Not on file  Social History Narrative  . Not on file   Social Determinants of Health   Financial Resource Strain:   . Difficulty of Paying Living Expenses: Not on file  Food Insecurity:   . Worried About Programme researcher, broadcasting/film/video in the Last Year: Not on file  . Ran Out of Food in the Last Year: Not on file  Transportation Needs:   . Lack of Transportation (Medical): Not on file  . Lack of Transportation (Non-Medical): Not on file  Physical Activity:   . Days of Exercise per Week: Not on file  . Minutes of Exercise per Session: Not on file  Stress:   . Feeling of Stress : Not on file  Social Connections:   .  Frequency of Communication with Friends and Family: Not on file  . Frequency of Social Gatherings with Friends and Family: Not on file  . Attends Religious Services: Not on file  . Active Member of Clubs or Organizations: Not on file  . Attends Banker Meetings: Not on file  . Marital Status: Not on file  Intimate Partner Violence:   . Fear of Current or Ex-Partner: Not on file  . Emotionally Abused: Not on file  . Physically Abused: Not on file  . Sexually Abused: Not on file    Outpatient Medications Prior to Visit  Medication Sig Dispense Refill  . amLODipine (NORVASC) 5 MG tablet TAKE 1 TABLET BY MOUTH EVERY DAY 30 tablet 5  . meloxicam (MOBIC) 15 MG tablet Take 1 tablet (15 mg total) by mouth daily as needed for pain. 30 tablet 3  . metoprolol succinate (TOPROL-XL) 100 MG 24 hr tablet Take 1 tablet (100 mg total) by  mouth daily. 90 tablet 1  . Norgestimate-Ethinyl Estradiol Triphasic (TRI-ESTARYLLA) 0.18/0.215/0.25 MG-35 MCG tablet Take 1 tablet by mouth daily. 28 tablet 11  . Dulaglutide (TRULICITY) 1.5 MG/0.5ML SOPN Inject 0.5 mLs (1.5 mg total) into the skin once a week. 30 DAY SUPPLY GIVEN.REFILLS REQUIRE APPOINTMENT AND LABWORK. 3 mL 0   No facility-administered medications prior to visit.    Allergies  Allergen Reactions  . Lisinopril     Angioedema  . Metformin And Related Other (See Comments)    GI upset  . Victoza [Liraglutide] Other (See Comments)    Soreness and bruising  . Amoxicillin   . Erythromycin   . Penicillins     ROS Review of Systems    Objective:    Physical Exam Constitutional:      Appearance: She is well-developed.  HENT:     Head: Normocephalic and atraumatic.  Cardiovascular:     Rate and Rhythm: Normal rate and regular rhythm.     Heart sounds: Normal heart sounds.  Pulmonary:     Effort: Pulmonary effort is normal.     Breath sounds: Normal breath sounds.  Skin:    General: Skin is warm and dry.  Neurological:     Mental Status: She is alert and oriented to person, place, and time.  Psychiatric:        Behavior: Behavior normal.     BP 135/75   Pulse (!) 54   Ht 5\' 3"  (1.6 m)   Wt 287 lb (130.2 kg)   SpO2 100%   BMI 50.84 kg/m  Wt Readings from Last 3 Encounters:  06/02/20 287 lb (130.2 kg)  12/14/19 279 lb (126.6 kg)  06/15/19 238 lb (108 kg)     There are no preventive care reminders to display for this patient.  There are no preventive care reminders to display for this patient.  Lab Results  Component Value Date   TSH 1.72 06/15/2019   Lab Results  Component Value Date   WBC 8.7 06/15/2019   HGB 12.5 06/15/2019   HCT 38.1 06/15/2019   MCV 79.7 (L) 06/15/2019   PLT 307 06/15/2019   Lab Results  Component Value Date   NA 138 12/14/2019   K 3.9 12/14/2019   CO2 28 12/14/2019   GLUCOSE 89 12/14/2019   BUN 9 12/14/2019    CREATININE 0.82 12/14/2019   BILITOT 0.4 12/14/2019   ALKPHOS 66 12/25/2016   AST 16 12/14/2019   ALT 18 12/14/2019   PROT 6.8 12/14/2019   ALBUMIN  3.5 (L) 12/25/2016   CALCIUM 8.8 12/14/2019   Lab Results  Component Value Date   CHOL 202 (H) 12/14/2019   Lab Results  Component Value Date   HDL 68 12/14/2019   Lab Results  Component Value Date   LDLCALC 105 (H) 12/14/2019   Lab Results  Component Value Date   TRIG 169 (H) 12/14/2019   Lab Results  Component Value Date   CHOLHDL 3.0 12/14/2019   Lab Results  Component Value Date   HGBA1C 5.5 06/02/2020      Assessment & Plan:   Problem List Items Addressed This Visit      Cardiovascular and Mediastinum   Essential hypertension - Primary (Chronic)    Well controlled. Continue current regimen. Follow up in  3-4 mo        Endocrine   Diabetes type 2, controlled (HCC)    A1c looks fantastic today at 5.5.  Discussed need for statin. We will continue Trulicity dose at 0.75 since her A1c looks so great.      Relevant Medications   Dulaglutide (TRULICITY) 0.75 MG/0.5ML SOPN   Other Relevant Orders   POCT glycosylated hemoglobin (Hb A1C) (Completed)     Other   BMI 40.0-44.9, adult (HCC)    Discussed options. I feel like she would be a great candidate for bariatric program that could offer her a very comprehensive treatment strategy for lifelong improvement. She says she is open to that we will go ahead and place referral today.      Relevant Medications   Dulaglutide (TRULICITY) 0.75 MG/0.5ML SOPN   Other Relevant Orders   Amb Ref to Medical Weight Management      Meds ordered this encounter  Medications  . Dulaglutide (TRULICITY) 0.75 MG/0.5ML SOPN    Sig: Inject 0.75 mg into the skin once a week.    Dispense:  2 mL    Refill:  3    Follow-up: Return in about 3 months (around 09/01/2020) for Diabetes follow-up.    Nani Gasser, MD

## 2020-06-02 ENCOUNTER — Ambulatory Visit (INDEPENDENT_AMBULATORY_CARE_PROVIDER_SITE_OTHER): Payer: Managed Care, Other (non HMO) | Admitting: Family Medicine

## 2020-06-02 ENCOUNTER — Other Ambulatory Visit: Payer: Self-pay

## 2020-06-02 VITALS — BP 135/75 | HR 54 | Ht 63.0 in | Wt 287.0 lb

## 2020-06-02 DIAGNOSIS — Z6841 Body Mass Index (BMI) 40.0 and over, adult: Secondary | ICD-10-CM

## 2020-06-02 DIAGNOSIS — I10 Essential (primary) hypertension: Secondary | ICD-10-CM | POA: Diagnosis not present

## 2020-06-02 DIAGNOSIS — E119 Type 2 diabetes mellitus without complications: Secondary | ICD-10-CM | POA: Diagnosis not present

## 2020-06-02 LAB — POCT GLYCOSYLATED HEMOGLOBIN (HGB A1C): Hemoglobin A1C: 5.5 % (ref 4.0–5.6)

## 2020-06-02 MED ORDER — TRULICITY 0.75 MG/0.5ML ~~LOC~~ SOAJ: 0.7500 mg | SUBCUTANEOUS | 3 refills | Status: DC

## 2020-06-02 NOTE — Assessment & Plan Note (Signed)
Well controlled. Continue current regimen. Follow up in  3-4 mo  

## 2020-06-02 NOTE — Assessment & Plan Note (Addendum)
A1c looks fantastic today at 5.5.  Discussed need for statin. We will continue Trulicity dose at 0.75 since her A1c looks so great.

## 2020-06-02 NOTE — Progress Notes (Signed)
While doing pt's foot exam she informed me of a vein on her R leg that was broken.

## 2020-06-03 ENCOUNTER — Encounter: Payer: Self-pay | Admitting: Family Medicine

## 2020-06-03 NOTE — Assessment & Plan Note (Signed)
Discussed options. I feel like she would be a great candidate for bariatric program that could offer her a very comprehensive treatment strategy for lifelong improvement. She says she is open to that we will go ahead and place referral today.

## 2020-06-29 ENCOUNTER — Other Ambulatory Visit: Payer: Self-pay | Admitting: Family Medicine

## 2020-07-26 ENCOUNTER — Other Ambulatory Visit: Payer: Self-pay | Admitting: Family Medicine

## 2020-07-26 DIAGNOSIS — I10 Essential (primary) hypertension: Secondary | ICD-10-CM

## 2020-08-04 ENCOUNTER — Telehealth: Payer: Self-pay | Admitting: Family Medicine

## 2020-08-04 NOTE — Telephone Encounter (Signed)
Patient dropped off paperwork to be filled out, said PCP knew what it was for, and if any questions or needed anything to give her a call. Placed in provider box. AM

## 2020-08-10 NOTE — Telephone Encounter (Signed)
Forms completed and faxed. Confirmation received and scanned into chart.

## 2020-08-10 NOTE — Telephone Encounter (Signed)
Forms signed for bariatric clearance.  Place in Deborah Carrillo's basket

## 2020-09-01 ENCOUNTER — Ambulatory Visit: Payer: Managed Care, Other (non HMO) | Admitting: Family Medicine

## 2020-09-15 ENCOUNTER — Other Ambulatory Visit: Payer: Self-pay

## 2020-09-15 ENCOUNTER — Encounter: Payer: Self-pay | Admitting: Family Medicine

## 2020-09-15 ENCOUNTER — Telehealth (INDEPENDENT_AMBULATORY_CARE_PROVIDER_SITE_OTHER): Payer: Managed Care, Other (non HMO) | Admitting: Family Medicine

## 2020-09-15 DIAGNOSIS — J069 Acute upper respiratory infection, unspecified: Secondary | ICD-10-CM | POA: Insufficient documentation

## 2020-09-15 DIAGNOSIS — E119 Type 2 diabetes mellitus without complications: Secondary | ICD-10-CM

## 2020-09-15 DIAGNOSIS — I809 Phlebitis and thrombophlebitis of unspecified site: Secondary | ICD-10-CM | POA: Diagnosis not present

## 2020-09-15 NOTE — Assessment & Plan Note (Signed)
Due for A1C . Will order labs for her to go next week when she feels better.

## 2020-09-15 NOTE — Progress Notes (Signed)
Pt was switched to a virtual due to having some nasal congestion. This started on yesterday. She denies any f/s/c/n/v/d/bodyache or sore throat. Denies any sick contacts.  She slept under a fan last night and woke up with nasal congestion, pressure behind her eyes and frontal headache.  She was hoping to be seen today because about 2 wks ago she went out dancing and she said that she was having some cramping in her feet and pain in her L leg in the shin area. The next day the area was tight with heat, painful to touch.  The swelling has gone down some but still sore to touch and she has noticed some bruising.

## 2020-09-15 NOTE — Assessment & Plan Note (Signed)
Based on her description of the are on her leg it is most consistent with either phlebitis or a superficial DVT.  Recommend treatment with either Aleve or ibuprofen discussed how to take the medication as well as compression she says is already getting some better on its own but this should help expedite things over the next 2 weeks if at any point it gets worse, gets larger becomes red or hot again or feels like she starting to get pain in her calf muscle then please let us know immediately.

## 2020-09-15 NOTE — Progress Notes (Signed)
Virtual Visit via Video Note  I connected with Deborah Carrillo on 09/15/20 at 10:30 AM EST by a video enabled telemedicine application and verified that I am speaking with the correct person using two identifiers.   I discussed the limitations of evaluation and management by telemedicine and the availability of in person appointments. The patient expressed understanding and agreed to proceed.  Patient location: at home  Provider location: in office  Subjective:    CC: works at daycare  HPI: Pt was switched to a virtual due to having some nasal congestion. This started on yesterday. She denies any f/s/c/n/v/d/bodyache or sore throat. Denies any sick contacts. She slept under a fan last night and woke up with nasal congestion, pressure behind her eyes and frontal headache. No cough.  Had COVID 9 about 9 months.   She was hoping to be seen today because about 2 wks ago she went out dancing and she said that she was having some cramping in her feet and pain in her L leg in the shin area. Over a large vein just lateral to the shin.  The next day the area was tight with heat, painful to touch.  The swelling has gone down some but still sore to touch and she has noticed some bruising. Still feels firm.    F/U Diabetes - we cut the Trulicity down bc of great A1C.  Now getting some swelling after her shot.    Past medical history, Surgical history, Family history not pertinant except as noted below, Social history, Allergies, and medications have been entered into the medical record, reviewed, and corrections made.   Review of Systems: No fevers, chills, night sweats, weight loss, chest pain, or shortness of breath.   Objective:    General: Speaking clearly in complete sentences without any shortness of breath.  Alert and oriented x3.  Normal judgment. No apparent acute distress.    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  Will test for COVID.  Without cough or fever flu is less likely.  Will call for results once available.  We'll need to quarantine until his results return.  Phlebitis Based on her description of the are on her leg it is most consistent with either phlebitis or a superficial DVT.  Recommend treatment with either Aleve or ibuprofen discussed how to take the medication as well as compression she says is already getting some better on its own but this should help expedite things over the next 2 weeks if at any point it gets worse, gets larger becomes red or hot again or feels like she starting to get pain in her calf muscle then please let us know immediately.  Diabetes type 2, controlled Due for A1C . Will order labs for her to go next week when she feels better.     Time spent in encounter 20 minutes.  I discussed the assessment and treatment plan with the patient. The patient was provided an opportunity to ask questions and all were answered. The patient agreed with the plan and demonstrated an understanding of the instructions.   The patient was advised to call back or seek an in-person evaluation if the symptoms worsen or if the condition fails to improve as anticipated.   Nani Gasser, MD

## 2020-09-15 NOTE — Assessment & Plan Note (Addendum)
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  Will test for COVID.  Without cough or fever flu is less likely.  Will call for results once available.  We'll need to quarantine until his results return.

## 2020-09-16 ENCOUNTER — Other Ambulatory Visit: Payer: Self-pay | Admitting: Family Medicine

## 2020-09-16 DIAGNOSIS — E119 Type 2 diabetes mellitus without complications: Secondary | ICD-10-CM

## 2020-09-19 LAB — GLU+CBC/D/PLT+RPR+RH+ABO+RU...

## 2020-09-20 LAB — NOVEL CORONAVIRUS, NAA

## 2020-11-07 ENCOUNTER — Other Ambulatory Visit: Payer: Self-pay | Admitting: Family Medicine

## 2020-11-09 ENCOUNTER — Other Ambulatory Visit: Payer: Self-pay | Admitting: *Deleted

## 2020-11-09 MED ORDER — NORGESTIM-ETH ESTRAD TRIPHASIC 0.18/0.215/0.25 MG-35 MCG PO TABS: 1.0000 | ORAL_TABLET | Freq: Every day | ORAL | 11 refills | Status: DC

## 2020-12-14 ENCOUNTER — Ambulatory Visit: Payer: Managed Care, Other (non HMO) | Admitting: Sports Medicine

## 2020-12-14 ENCOUNTER — Ambulatory Visit (INDEPENDENT_AMBULATORY_CARE_PROVIDER_SITE_OTHER): Payer: Managed Care, Other (non HMO)

## 2020-12-14 ENCOUNTER — Other Ambulatory Visit: Payer: Self-pay

## 2020-12-14 DIAGNOSIS — M1712 Unilateral primary osteoarthritis, left knee: Secondary | ICD-10-CM

## 2020-12-14 DIAGNOSIS — M25561 Pain in right knee: Secondary | ICD-10-CM | POA: Diagnosis not present

## 2020-12-14 NOTE — Progress Notes (Signed)
    Procedures performed today:    Procedure: Real-time Ultrasound Guided aspiration/injection of left knee Device: Samsung HS60  Verbal informed consent obtained.  Time-out conducted.  Noted no overlying erythema, induration, or other signs of local infection.  Skin prepped in a sterile fashion.  Local anesthesia: Topical Ethyl chloride.  With sterile technique and under real time ultrasound guidance:  Noted effusion, using a 22-gauge spinal needle advanced into the effused knee capsule and aspirated some clear, yellow fluid, syringe switched and 1 cc Kenalog 40, 2 cc lidocaine, 2 cc bupivacaine injected easily Completed without difficulty  Advised to call if fevers/chills, erythema, induration, drainage, or persistent bleeding.  Images permanently stored and available for review in PACS.  Impression: Technically successful ultrasound guided injection.  Independent interpretation of notes and tests performed by another provider:   None.  Brief History, Exam, Impression, and Recommendations:    Primary osteoarthritis of left knee Deborah Carrillo is a pleasant 40 year old female, I last treated her for knee osteoarthritis 4 years ago with an injection she did well, now having recurrence of pain. Repeat aspiration and injection today, getting updated x-rays. Return to see me in a month. She is working hard on weight loss with her PCP.    ___________________________________________ Ihor Austin. Benjamin Stain, M.D., ABFM., CAQSM. Primary Care and Sports Medicine [facility]  Adjunct Instructor of Family Medicine  [employer]

## 2020-12-14 NOTE — Assessment & Plan Note (Signed)
Deborah Carrillo is a pleasant 40 year old female, I last treated her for knee osteoarthritis 4 years ago with an injection she did well, now having recurrence of pain. Repeat aspiration and injection today, getting updated x-rays. Return to see me in a month. She is working hard on weight loss with her PCP.

## 2021-01-04 ENCOUNTER — Other Ambulatory Visit: Payer: Self-pay | Admitting: Family Medicine

## 2021-01-09 ENCOUNTER — Other Ambulatory Visit: Payer: Self-pay | Admitting: Family Medicine

## 2021-01-09 DIAGNOSIS — E119 Type 2 diabetes mellitus without complications: Secondary | ICD-10-CM

## 2021-01-11 ENCOUNTER — Ambulatory Visit: Payer: Managed Care, Other (non HMO) | Admitting: Sports Medicine

## 2021-01-11 ENCOUNTER — Other Ambulatory Visit: Payer: Self-pay

## 2021-01-11 ENCOUNTER — Telehealth: Payer: Self-pay | Admitting: Sports Medicine

## 2021-01-11 DIAGNOSIS — M170 Bilateral primary osteoarthritis of knee: Secondary | ICD-10-CM | POA: Diagnosis not present

## 2021-01-11 MED ORDER — TRAMADOL HCL 50 MG PO TABS: 50.0000 mg | ORAL_TABLET | Freq: Three times a day (TID) | ORAL | 0 refills | Status: DC | PRN

## 2021-01-11 NOTE — Assessment & Plan Note (Signed)
Deborah Carrillo has bilateral knee osteoarthritis, we injected her left knee at the last visit she returns today about 50 to 60% better. She still has significant discomfort and did not get much efficacy from meloxicam so we will switch to tramadol, and we are going to also work on getting her approved for viscosupplementation in both knees. Return to start viscosupplementation injections when approved.

## 2021-01-11 NOTE — Progress Notes (Signed)
    Procedures performed today:    None.  Independent interpretation of notes and tests performed by another provider:   None.  Brief History, Exam, Impression, and Recommendations:    Primary osteoarthritis of both knees Deborah Carrillo has bilateral knee osteoarthritis, we injected her left knee at the last visit she returns today about 50 to 60% better. She still has significant discomfort and did not get much efficacy from meloxicam so we will switch to tramadol, and we are going to also work on getting her approved for viscosupplementation in both knees. Return to start viscosupplementation injections when approved.    ___________________________________________ Ihor Austin. Benjamin Stain, M.D., ABFM., CAQSM. Primary Care and Sports Medicine [facility]  Adjunct Instructor of Family Medicine  [employer]

## 2021-01-11 NOTE — Telephone Encounter (Signed)
Please work on [employer] or any other [employer] supplement approval for this patient, bilateral, x-ray confirmed and failed steroid injections as well as NSAIDs.

## 2021-01-13 ENCOUNTER — Ambulatory Visit: Payer: Managed Care, Other (non HMO) | Admitting: Sports Medicine

## 2021-02-02 ENCOUNTER — Encounter: Payer: Self-pay | Admitting: Family Medicine

## 2021-02-02 ENCOUNTER — Ambulatory Visit: Payer: Managed Care, Other (non HMO) | Admitting: Family Medicine

## 2021-02-02 ENCOUNTER — Other Ambulatory Visit: Payer: Self-pay | Admitting: Family Medicine

## 2021-02-02 ENCOUNTER — Other Ambulatory Visit: Payer: Self-pay

## 2021-02-02 VITALS — BP 153/92 | HR 66 | Ht 63.0 in | Wt 305.0 lb

## 2021-02-02 DIAGNOSIS — E119 Type 2 diabetes mellitus without complications: Secondary | ICD-10-CM

## 2021-02-02 DIAGNOSIS — E785 Hyperlipidemia, unspecified: Secondary | ICD-10-CM | POA: Diagnosis not present

## 2021-02-02 DIAGNOSIS — I10 Essential (primary) hypertension: Secondary | ICD-10-CM | POA: Diagnosis not present

## 2021-02-02 DIAGNOSIS — F4321 Adjustment disorder with depressed mood: Secondary | ICD-10-CM

## 2021-02-02 DIAGNOSIS — L301 Dyshidrosis [pompholyx]: Secondary | ICD-10-CM

## 2021-02-02 DIAGNOSIS — E118 Type 2 diabetes mellitus with unspecified complications: Secondary | ICD-10-CM | POA: Diagnosis not present

## 2021-02-02 DIAGNOSIS — Z6841 Body Mass Index (BMI) 40.0 and over, adult: Secondary | ICD-10-CM

## 2021-02-02 LAB — POCT GLYCOSYLATED HEMOGLOBIN (HGB A1C): HbA1c, POC (controlled diabetic range): 6.1 % (ref 0.0–7.0)

## 2021-02-02 MED ORDER — TRIAMCINOLONE ACETONIDE 0.1 % EX CREA: 1.0000 "application " | TOPICAL_CREAM | Freq: Two times a day (BID) | CUTANEOUS | 0 refills | Status: DC

## 2021-02-02 MED ORDER — TRULICITY 0.75 MG/0.5ML ~~LOC~~ SOAJ: 0.7500 mg | SUBCUTANEOUS | 3 refills | Status: DC

## 2021-02-02 NOTE — Assessment & Plan Note (Signed)
A1c actually looks great today even off of the Trulicity for couple of months.  It certainly up to her if she wants to restart it or not.  I could actually help curb her appetite since she has been stress eating lately but she is also doing well without it in regards to her diabetes.  I did go ahead and send a refill today in case she decides to restart it.  Otherwise follow-up in 3 to 4 months.  Lab Results  Component Value Date   HGBA1C 6.1 02/02/2021

## 2021-02-02 NOTE — Patient Instructions (Signed)
Encourage you to think about EAP through work.

## 2021-02-02 NOTE — Assessment & Plan Note (Signed)
Repeat blood pressure is elevated today though she is somewhat upset.  And she has gained weight which could be contributing to the elevation.  I would really like for her to keep an eye on it over the next 2 weeks and if it does not come back down then please let us know so we can make adjustments to her blood pressure medication regimen

## 2021-02-02 NOTE — Progress Notes (Signed)
Established Patient Office Visit  Subjective:  Patient ID: Deborah Carrillo, female    DOB: 1981/03/14  Age: 40 y.o. MRN: [id number]  CC:  Chief Complaint  Patient presents with  . Hypertension  . Diabetes    HPI Deborah Carrillo presents for BP and DM.  Her mom passed away since she was last here.  She is not doing grief counseling but is back at work.  She has gained some of her weight back from stress eating.  She says she does not even really feel that hungry she is just eating.  Follow-up diabetes-she actually has come off of the Trulicity over the last couple of months in hopes of doing well without it.  She is also been getting a dry scaly rash on her hands she says at work they diagnosed it as eczema she has been try to use a good moisturizer on it but it really does not seem to help.  She has also felt a knot in her mid abdomen she says she is noticed a couple times what is actually bulged outward.  She does have prior history of gallbladder surgery  Past Medical History:  Diagnosis Date  . Acute depression 06/15/2019  . Essential hypertension 09/24/2012  . Hyperlipidemia 12/28/2016  . Polycystic ovary disease   . Primary osteoarthritis of left knee 06/21/2017  . Tobacco abuse     Past Surgical History:  Procedure Laterality Date  . CESAREAN SECTION  2001  . CHOLECYSTECTOMY  2020  . TYMPANOPLASTY  1988    Family History  Problem Relation Age of Onset  . Diabetes Maternal Grandmother   . Hypothyroidism Mother   . Lung cancer Mother   . Diabetes Mother     Social History   Socioeconomic History  . Marital status: Legally Separated    Spouse name: Not on file  . Number of children: Not on file  . Years of education: Not on file  . Highest education level: Not on file  Occupational History  . Occupation: Merchant navy officer: [employer]  Tobacco Use  . Smoking status: Former Smoker    Packs/day: 0.50    Years: 14.00    Pack years: 7.00     Types: Cigarettes    Quit date: 03/03/2018    Years since quitting: 2.9  . Smokeless tobacco: Never Used  Substance and Sexual Activity  . Alcohol use: No  . Drug use: No  . Sexual activity: Yes    Partners: Male  Other Topics Concern  . Not on file  Social History Narrative  . Not on file   Social Determinants of Health   Financial Resource Strain: Not on file  Food Insecurity: Not on file  Transportation Needs: Not on file  Physical Activity: Not on file  Stress: Not on file  Social Connections: Not on file  Intimate Partner Violence: Not on file    Outpatient Medications Prior to Visit  Medication Sig Dispense Refill  . amLODipine (NORVASC) 5 MG tablet TAKE 1 TABLET BY MOUTH EVERY DAY 30 tablet 5  . metoprolol succinate (TOPROL-XL) 100 MG 24 hr tablet Take 1 tablet (100 mg total) by mouth daily. Needs appt 30 tablet 0  . Norgestimate-Ethinyl Estradiol Triphasic (TRI-ESTARYLLA) 0.18/0.215/0.25 MG-35 MCG tablet Take 1 tablet by mouth daily. 28 tablet 11  . traMADol (ULTRAM) 50 MG tablet Take 1-2 tablets (50-100 mg total) by mouth every 8 (eight) hours as needed for moderate pain. Maximum 6 tabs per  day. 21 tablet 0  . meloxicam (MOBIC) 15 MG tablet Take 1 tablet (15 mg total) by mouth daily as needed for pain. 30 tablet 3  . TRULICITY 0.75 MG/0.5ML SOPN INJECT 0.75 MG INTO THE SKIN ONCE A WEEK. 2 mL 3   No facility-administered medications prior to visit.    Allergies  Allergen Reactions  . Lisinopril     Angioedema  . Metformin And Related Other (See Comments)    GI upset  . Victoza [Liraglutide] Other (See Comments)    Soreness and bruising  . Amoxicillin   . Erythromycin   . Penicillins     ROS Review of Systems    Objective:    Physical Exam Constitutional:      Appearance: She is well-developed.  HENT:     Head: Normocephalic and atraumatic.  Cardiovascular:     Rate and Rhythm: Normal rate and regular rhythm.     Heart sounds: Normal heart sounds.   Pulmonary:     Effort: Pulmonary effort is normal.     Breath sounds: Normal breath sounds.  Abdominal:     Comments: She has a firm area under the skin in the epigastric area.  Unclear etiology possible lipoma as it feels like it is little bit deeper than in the superficial skin layer or consider hernia.  We will keep an eye on it and let me know  Skin:    General: Skin is warm and dry.  Neurological:     Mental Status: She is alert and oriented to person, place, and time.  Psychiatric:        Behavior: Behavior normal.     BP (!) 153/92   Pulse 66   Ht 5\' 3"  (1.6 m)   Wt (!) 305 lb (138.3 kg)   SpO2 94%   BMI 54.03 kg/m  Wt Readings from Last 3 Encounters:  02/02/21 (!) 305 lb (138.3 kg)  06/02/20 287 lb (130.2 kg)  12/14/19 279 lb (126.6 kg)     Health Maintenance Due  Topic Date Due  . URINE MICROALBUMIN  12/13/2020    There are no preventive care reminders to display for this patient.  Lab Results  Component Value Date   TSH 1.72 06/15/2019   Lab Results  Component Value Date   WBC 8.7 06/15/2019   HGB 12.5 06/15/2019   HCT 38.1 06/15/2019   MCV 79.7 (L) 06/15/2019   PLT 307 06/15/2019   Lab Results  Component Value Date   NA 138 12/14/2019   K 3.9 12/14/2019   CO2 28 12/14/2019   GLUCOSE CANCELED 09/15/2020   BUN 9 12/14/2019   CREATININE 0.82 12/14/2019   BILITOT 0.4 12/14/2019   ALKPHOS 66 12/25/2016   AST 16 12/14/2019   ALT 18 12/14/2019   PROT 6.8 12/14/2019   ALBUMIN 3.5 (L) 12/25/2016   CALCIUM 8.8 12/14/2019   Lab Results  Component Value Date   CHOL 202 (H) 12/14/2019   Lab Results  Component Value Date   HDL 68 12/14/2019   Lab Results  Component Value Date   LDLCALC 105 (H) 12/14/2019   Lab Results  Component Value Date   TRIG 169 (H) 12/14/2019   Lab Results  Component Value Date   CHOLHDL 3.0 12/14/2019   Lab Results  Component Value Date   HGBA1C 6.1 02/02/2021      Assessment & Plan:   Problem List Items  Addressed This Visit      Cardiovascular and Mediastinum  Essential hypertension - Primary (Chronic)    Repeat blood pressure is elevated today though she is somewhat upset.  And she has gained weight which could be contributing to the elevation.  I would really like for her to keep an eye on it over the next 2 weeks and if it does not come back down then please let us know so we can make adjustments to her blood pressure medication regimen      Relevant Orders   CBC   COMPLETE METABOLIC PANEL WITH GFR   Lipid panel     Endocrine   Controlled diabetes mellitus type 2 with complications, unspecified whether long term insulin use (HCC)    A1c actually looks great today even off of the Trulicity for couple of months.  It certainly up to her if she wants to restart it or not.  I could actually help curb her appetite since she has been stress eating lately but she is also doing well without it in regards to her diabetes.  I did go ahead and send a refill today in case she decides to restart it.  Otherwise follow-up in 3 to 4 months.  Lab Results  Component Value Date   HGBA1C 6.1 02/02/2021         Relevant Medications   Dulaglutide (TRULICITY) 0.75 MG/0.5ML SOPN   Other Relevant Orders   POCT glycosylated hemoglobin (Hb A1C) (Completed)   CBC   COMPLETE METABOLIC PANEL WITH GFR   Lipid panel   Urine Microalbumin w/creat. ratio     Other   Hyperlipidemia    Due for updated labs and to repeat lipid panel.      BMI 40.0-44.9, adult (HCC)   Relevant Medications   Dulaglutide (TRULICITY) 0.75 MG/0.5ML SOPN    Other Visit Diagnoses    Grief       Dyshidrotic eczema       Relevant Medications   triamcinolone cream (KENALOG) 0.1 %   Controlled type 2 diabetes mellitus without complication, without long-term current use of insulin (HCC)       Relevant Medications   Dulaglutide (TRULICITY) 0.75 MG/0.5ML SOPN      Grief-encouraged her to consider doing some grief counseling or  even doing EAP through work.  Or happy to coordinate anything if she would like.  Dyshidrotic eczema-discussed treatment with moisturizing really well and using a cream at night she does wash her hands a lot at work so that certainly could be contributing might even want to try to change soaps if possible just to see if it is causing some irritation okay to use a low-dose as needed steroid.  Meds ordered this encounter  Medications  . triamcinolone cream (KENALOG) 0.1 %    Sig: Apply 1 application topically 2 (two) times daily.    Dispense:  30 g    Refill:  0  . Dulaglutide (TRULICITY) 0.75 MG/0.5ML SOPN    Sig: Inject 0.75 mg into the skin once a week.    Dispense:  2 mL    Refill:  3    Follow-up: No follow-ups on file.     Nani Gasser, MD

## 2021-02-02 NOTE — Assessment & Plan Note (Signed)
Due for updated labs and to repeat lipid panel.

## 2021-02-03 LAB — COMPLETE METABOLIC PANEL WITH GFR
AG Ratio: 1.1 (calc) (ref 1.0–2.5)
ALT: 13 U/L (ref 6–29)
AST: 12 U/L (ref 10–30)
Albumin: 3.6 g/dL (ref 3.6–5.1)
Alkaline phosphatase (APISO): 63 U/L (ref 31–125)
BUN: 10 mg/dL (ref 7–25)
CO2: 28 mmol/L (ref 20–32)
Calcium: 9 mg/dL (ref 8.6–10.2)
Chloride: 104 mmol/L (ref 98–110)
Creat: 0.83 mg/dL (ref 0.50–1.10)
GFR, Est African American: 102 mL/min/{1.73_m2} (ref >=60)
GFR, Est Non African American: 88 mL/min/{1.73_m2} (ref >=60)
Globulin: 3.3 g/dL (calc) (ref 1.9–3.7)
Glucose, Bld: 120 mg/dL — ABNORMAL HIGH (ref 65–99)
Potassium: 3.9 mmol/L (ref 3.5–5.3)
Sodium: 139 mmol/L (ref 135–146)
Total Bilirubin: 0.3 mg/dL (ref 0.2–1.2)
Total Protein: 6.9 g/dL (ref 6.1–8.1)

## 2021-02-03 LAB — LIPID PANEL
Cholesterol: 206 mg/dL — ABNORMAL HIGH (ref <200)
HDL: 64 mg/dL (ref >=50)
LDL Cholesterol (Calc): 117 mg/dL (calc) — ABNORMAL HIGH
Non-HDL Cholesterol (Calc): 142 mg/dL (calc) — ABNORMAL HIGH (ref <130)
Total CHOL/HDL Ratio: 3.2 (calc) (ref <5.0)
Triglycerides: 133 mg/dL (ref <150)

## 2021-02-03 LAB — CBC
HCT: 35.4 % (ref 35.0–45.0)
Hemoglobin: 11.7 g/dL (ref 11.7–15.5)
MCH: 25.3 pg — ABNORMAL LOW (ref 27.0–33.0)
MCHC: 33.1 g/dL (ref 32.0–36.0)
MCV: 76.6 fL — ABNORMAL LOW (ref 80.0–100.0)
MPV: 10.6 fL (ref 7.5–12.5)
Platelets: 291 10*3/uL (ref 140–400)
RBC: 4.62 10*6/uL (ref 3.80–5.10)
RDW: 14.4 % (ref 11.0–15.0)
WBC: 7.6 10*3/uL (ref 3.8–10.8)

## 2021-02-03 LAB — MICROALBUMIN / CREATININE URINE RATIO
Creatinine, Urine: 231 mg/dL (ref 20–275)
Microalb Creat Ratio: 16 mcg/mg creat (ref <30)
Microalb, Ur: 3.6 mg/dL

## 2021-02-06 ENCOUNTER — Other Ambulatory Visit: Payer: Self-pay | Admitting: Family Medicine

## 2021-02-06 DIAGNOSIS — I10 Essential (primary) hypertension: Secondary | ICD-10-CM

## 2021-02-06 NOTE — Telephone Encounter (Signed)
Sent through [facility] waiting on BID and to see if PA is required. - CF 

## 2021-02-08 ENCOUNTER — Other Ambulatory Visit: Payer: Self-pay

## 2021-02-08 ENCOUNTER — Ambulatory Visit (INDEPENDENT_AMBULATORY_CARE_PROVIDER_SITE_OTHER): Payer: Managed Care, Other (non HMO)

## 2021-02-08 ENCOUNTER — Ambulatory Visit (INDEPENDENT_AMBULATORY_CARE_PROVIDER_SITE_OTHER): Payer: Managed Care, Other (non HMO) | Admitting: Sports Medicine

## 2021-02-08 DIAGNOSIS — M170 Bilateral primary osteoarthritis of knee: Secondary | ICD-10-CM

## 2021-02-08 NOTE — Telephone Encounter (Signed)
Patient is scheduled - CF

## 2021-02-08 NOTE — Progress Notes (Signed)
    Procedures performed today:    Procedure: Real-time Ultrasound Guided injection of the left knee Device: Samsung HS60  Verbal informed consent obtained.  Time-out conducted.  Noted no overlying erythema, induration, or other signs of local infection.  Skin prepped in a sterile fashion.  Local anesthesia: Topical Ethyl chloride.  With sterile technique and under real time ultrasound guidance:  Noted trace effusion, 30 mg/2 mL of OrthoVisc (sodium hyaluronate) in a prefilled syringe was injected easily into the knee through a 22-gauge needle. Completed without difficulty  Advised to call if fevers/chills, erythema, induration, drainage, or persistent bleeding.  Images permanently stored and available for review in PACS.  Impression: Technically successful ultrasound guided injection.  Procedure: Real-time Ultrasound Guided injection of the right knee Device: Samsung HS60  Verbal informed consent obtained.  Time-out conducted.  Noted no overlying erythema, induration, or other signs of local infection.  Skin prepped in a sterile fashion.  Local anesthesia: Topical Ethyl chloride.  With sterile technique and under real time ultrasound guidance:  Noted no effusion.  I advanced a 22-gauge spinal needle into the suprapatellar recess, 30 mg/2 mL of OrthoVisc (sodium hyaluronate) in a prefilled syringe was injected easily into the knee, syringe switched and 1 cc lidocaine used to flush the needle. Completed without difficulty  Advised to call if fevers/chills, erythema, induration, drainage, or persistent bleeding.  Images permanently stored and available for review in PACS.  Impression: Technically successful ultrasound guided injection.  Independent interpretation of notes and tests performed by another provider:   None.  Brief History, Exam, Impression, and Recommendations:    Primary osteoarthritis of both knees Orthovisc No. 1 of 4 into the both knees, return in 1 week for #2 of  4 both knees. Of note due to body habitus we will need to use spinal needles with a 1 cc lidocaine flush for each 1 of these procedures.    ___________________________________________ Ihor Austin. Benjamin Stain, M.D., ABFM., CAQSM. Primary Care and Sports Medicine [facility]  Adjunct Instructor of Family Medicine  [employer]

## 2021-02-08 NOTE — Assessment & Plan Note (Addendum)
Orthovisc No. 1 of 4 into the both knees, return in 1 week for #2 of 4 both knees. Of note due to body habitus we will need to use spinal needles with a 1 cc lidocaine flush for each 1 of these procedures.

## 2021-02-15 ENCOUNTER — Ambulatory Visit (INDEPENDENT_AMBULATORY_CARE_PROVIDER_SITE_OTHER): Payer: Managed Care, Other (non HMO)

## 2021-02-15 ENCOUNTER — Other Ambulatory Visit: Payer: Self-pay

## 2021-02-15 ENCOUNTER — Ambulatory Visit (INDEPENDENT_AMBULATORY_CARE_PROVIDER_SITE_OTHER): Payer: Managed Care, Other (non HMO) | Admitting: Sports Medicine

## 2021-02-15 DIAGNOSIS — M25571 Pain in right ankle and joints of right foot: Secondary | ICD-10-CM | POA: Diagnosis not present

## 2021-02-15 DIAGNOSIS — G8929 Other chronic pain: Secondary | ICD-10-CM | POA: Diagnosis not present

## 2021-02-15 DIAGNOSIS — M170 Bilateral primary osteoarthritis of knee: Secondary | ICD-10-CM | POA: Diagnosis not present

## 2021-02-15 NOTE — Assessment & Plan Note (Signed)
Orthovisc No. 2 of 4 into both knees with spinal needle and lidocaine flush. Return to see me in 1 week for Orthovisc No. 3 of 4 both knees.

## 2021-02-15 NOTE — Assessment & Plan Note (Signed)
Deborah Carrillo has right ankle pain on and off for the last 2 weeks, no injury, trauma, pain at the posterior lateral joint line. Continue to wear supportive shoes, we will get some x-rays. I will also give her some ankle rehab exercises to do in the meantime.

## 2021-02-15 NOTE — Progress Notes (Addendum)
    Procedures performed today:    Procedure: Real-time Ultrasound Guidedinjection of the right knee Device: Samsung HS60  Verbal informed consent obtained.  Time-out conducted.  Noted no overlying erythema, induration, or other signs of local infection.  Skin prepped in a sterile fashion.  Local anesthesia: Topical Ethyl chloride.  With sterile technique and under real time ultrasound guidance: Noted no effusion.  I advanced a 22-gauge spinal needle into the suprapatellar recess, 30 mg/2 mL of OrthoVisc (sodium hyaluronate) in a prefilled syringe was injected easily into the knee, syringe switched and 1 cc lidocaine used to flush the needle. Completed without difficulty  Advised to call if fevers/chills, erythema, induration, drainage, or persistent bleeding.  Images permanently stored and available for review in PACS.  Impression: Technically successful ultrasound guided injection.  Procedure: Real-time Ultrasound Guidedinjection of the left knee Device: Samsung HS60  Verbal informed consent obtained.  Time-out conducted.  Noted no overlying erythema, induration, or other signs of local infection.  Skin prepped in a sterile fashion.  Local anesthesia: Topical Ethyl chloride.  With sterile technique and under real time ultrasound guidance: Noted moderate effusion.  I advanced a 22-gauge spinal needle into the suprapatellar recess, 30 mg/2 mL of OrthoVisc (sodium hyaluronate) in a prefilled syringe was injected easily into the knee, syringe switched and 1 cc lidocaine used to flush the needle. Completed without difficulty  Advised to call if fevers/chills, erythema, induration, drainage, or persistent bleeding.  Images permanently stored and available for review in PACS.  Impression: Technically successful ultrasound guided injection.  Independent interpretation of notes and tests performed by another provider:   None.  Brief History, Exam, Impression, and Recommendations:     Primary osteoarthritis of both knees Orthovisc No. 2 of 4 into both knees with spinal needle and lidocaine flush. Return to see me in 1 week for Orthovisc No. 3 of 4 both knees.  Right ankle pain Allyn has right ankle pain on and off for the last 2 weeks, no injury, trauma, pain at the posterior lateral joint line. Continue to wear supportive shoes, we will get some x-rays. I will also give her some ankle rehab exercises to do in the meantime.    ___________________________________________ Ihor Austin. Benjamin Stain, M.D., ABFM., CAQSM. Primary Care and Sports Medicine [facility]  Adjunct Instructor of Family Medicine  [employer]

## 2021-02-15 NOTE — Addendum Note (Signed)
Addended by: Monica Becton on: 02/15/2021 04:05 PM   Modules accepted: Orders, Level of Service

## 2021-02-16 NOTE — Telephone Encounter (Signed)
I called [employer] and did PA patient approved and covered at 100% she is scheduled for orthovisc appointments. - CF

## 2021-02-22 ENCOUNTER — Ambulatory Visit (INDEPENDENT_AMBULATORY_CARE_PROVIDER_SITE_OTHER): Payer: Managed Care, Other (non HMO) | Admitting: Sports Medicine

## 2021-02-22 ENCOUNTER — Other Ambulatory Visit: Payer: Self-pay

## 2021-02-22 ENCOUNTER — Ambulatory Visit (INDEPENDENT_AMBULATORY_CARE_PROVIDER_SITE_OTHER): Payer: Managed Care, Other (non HMO)

## 2021-02-22 DIAGNOSIS — M170 Bilateral primary osteoarthritis of knee: Secondary | ICD-10-CM

## 2021-02-22 NOTE — Progress Notes (Signed)
    Procedures performed today:    Procedure: Real-time Ultrasound Guidedinjection of theright knee Device: Samsung HS60  Verbal informed consent obtained.  Time-out conducted.  Noted no overlying erythema, induration, or other signs of local infection.  Skin prepped in a sterile fashion.  Local anesthesia: Topical Ethyl chloride.  With sterile technique and under real time ultrasound guidance:Noted no effusion. I advanced a 22-gauge spinal needle into the suprapatellar recess, 30 mg/2 mL of OrthoVisc (sodium hyaluronate) in a prefilled syringe was injected easily into the knee, syringe switched and 1 cc lidocaine used to flush the needle. Completed without difficulty  Advised to call if fevers/chills, erythema, induration, drainage, or persistent bleeding.  Images permanently stored and available for review in PACS.  Impression: Technically successful ultrasound guided injection.  Procedure: Real-time Ultrasound Guidedinjection of theleft knee Device: Samsung HS60  Verbal informed consent obtained.  Time-out conducted.  Noted no overlying erythema, induration, or other signs of local infection.  Skin prepped in a sterile fashion.  Local anesthesia: Topical Ethyl chloride.  With sterile technique and under real time ultrasound guidance:Noted moderate effusion. I advanced a 22-gauge spinal needle into the suprapatellar recess, 30 mg/2 mL of OrthoVisc (sodium hyaluronate) in a prefilled syringe was injected easily into the knee, syringe switched and 1 cc lidocaine used to flush the needle. Completed without difficulty  Advised to call if fevers/chills, erythema, induration, drainage, or persistent bleeding.  Images permanently stored and available for review in PACS.  Impression: Technically successful ultrasound guided injection.  Independent interpretation of notes and tests performed by another provider:   None.  Brief History, Exam, Impression, and Recommendations:     Primary osteoarthritis of both knees Orthovisc No. 3 of 4 in both knees, approximately 50% better, we did spinal needle and lidocaine flush, return in 1 week for #4 of 4 both knees.    ___________________________________________ Ihor Austin. Benjamin Stain, M.D., ABFM., CAQSM. Primary Care and Sports Medicine [facility]  Adjunct Instructor of Family Medicine  [employer]

## 2021-02-22 NOTE — Assessment & Plan Note (Addendum)
Orthovisc No. 3 of 4 in both knees, approximately 50% better, we did spinal needle and lidocaine flush, return in 1 week for #4 of 4 both knees.

## 2021-03-01 ENCOUNTER — Ambulatory Visit (INDEPENDENT_AMBULATORY_CARE_PROVIDER_SITE_OTHER): Payer: Managed Care, Other (non HMO) | Admitting: Sports Medicine

## 2021-03-01 ENCOUNTER — Other Ambulatory Visit: Payer: Self-pay

## 2021-03-01 ENCOUNTER — Ambulatory Visit (INDEPENDENT_AMBULATORY_CARE_PROVIDER_SITE_OTHER): Payer: Managed Care, Other (non HMO)

## 2021-03-01 DIAGNOSIS — M170 Bilateral primary osteoarthritis of knee: Secondary | ICD-10-CM | POA: Diagnosis not present

## 2021-03-01 NOTE — Progress Notes (Signed)
    Procedures performed today:    Procedure: Real-time Ultrasound Guided injection of the right knee Device: Samsung HS60 Verbal informed consent obtained. Time-out conducted. Noted no overlying erythema, induration, or other signs of local infection. Skin prepped in a sterile fashion. Local anesthesia: Topical Ethyl chloride. With sterile technique and under real time ultrasound guidance:  Noted no effusion.  I advanced a 22-gauge spinal needle into the suprapatellar recess, 30 mg/2 mL of OrthoVisc (sodium hyaluronate) in a prefilled syringe was injected easily into the knee, syringe switched and 1 cc lidocaine used to flush the needle. Completed without difficulty Advised to call if fevers/chills, erythema, induration, drainage, or persistent bleeding. Images permanently stored and available for review in PACS. Impression: Technically successful ultrasound guided injection.   Procedure: Real-time Ultrasound Guided injection of the left knee Device: Samsung HS60 Verbal informed consent obtained. Time-out conducted. Noted no overlying erythema, induration, or other signs of local infection. Skin prepped in a sterile fashion. Local anesthesia: Topical Ethyl chloride. With sterile technique and under real time ultrasound guidance:  Noted moderate effusion.  I advanced a 22-gauge spinal needle into the suprapatellar recess, 30 mg/2 mL of OrthoVisc (sodium hyaluronate) in a prefilled syringe was injected easily into the knee, syringe switched and 1 cc lidocaine used to flush the needle. Completed without difficulty Advised to call if fevers/chills, erythema, induration, drainage, or persistent bleeding. Images permanently stored and available for review in PACS. Impression: Technically successful ultrasound guided injection.  Independent interpretation of notes and tests performed by another provider:   None.  Brief History, Exam, Impression, and Recommendations:    Primary  osteoarthritis of both knees Orthovisc No. 4 of 4 into both knees, we used a spinal needle and a lidocaine flush. Return in 1 month as needed.    ___________________________________________ Ihor Austin. Benjamin Stain, M.D., ABFM., CAQSM. Primary Care and Sports Medicine [facility]  Adjunct Instructor of Family Medicine  [employer]

## 2021-03-01 NOTE — Assessment & Plan Note (Signed)
Orthovisc No. 4 of 4 into both knees, we used a spinal needle and a lidocaine flush. Return in 1 month as needed.

## 2021-04-28 ENCOUNTER — Other Ambulatory Visit: Payer: Self-pay | Admitting: Family Medicine

## 2021-04-28 NOTE — Telephone Encounter (Signed)
Called pt and no answer, LVM to call and schedule an appt for f/u for bp and dm, and I also let her know that a 30 day supply was sent in until she makes her appt. - tvt

## 2021-04-28 NOTE — Telephone Encounter (Signed)
Pt due for f/u Office visit for bp and DM. Please have her schedule appt.  30 day supply sent for now.

## 2021-05-23 ENCOUNTER — Other Ambulatory Visit: Payer: Self-pay | Admitting: Family Medicine

## 2021-05-23 NOTE — Telephone Encounter (Signed)
Call pt. Appointment required for refills.

## 2021-05-24 NOTE — Telephone Encounter (Signed)
Pt scheduled for follow up and refills

## 2021-05-25 ENCOUNTER — Other Ambulatory Visit: Payer: Self-pay | Admitting: Family Medicine

## 2021-05-25 DIAGNOSIS — E119 Type 2 diabetes mellitus without complications: Secondary | ICD-10-CM

## 2021-06-03 ENCOUNTER — Other Ambulatory Visit: Payer: Self-pay | Admitting: Family Medicine

## 2021-06-03 DIAGNOSIS — L301 Dyshidrosis [pompholyx]: Secondary | ICD-10-CM

## 2021-06-05 ENCOUNTER — Encounter: Payer: Self-pay | Admitting: Family Medicine

## 2021-06-05 ENCOUNTER — Ambulatory Visit (INDEPENDENT_AMBULATORY_CARE_PROVIDER_SITE_OTHER): Payer: Managed Care, Other (non HMO) | Admitting: Family Medicine

## 2021-06-05 ENCOUNTER — Other Ambulatory Visit: Payer: Self-pay

## 2021-06-05 VITALS — BP 142/74 | HR 64 | Ht 63.0 in | Wt 313.0 lb

## 2021-06-05 DIAGNOSIS — Z6841 Body Mass Index (BMI) 40.0 and over, adult: Secondary | ICD-10-CM | POA: Diagnosis not present

## 2021-06-05 DIAGNOSIS — E118 Type 2 diabetes mellitus with unspecified complications: Secondary | ICD-10-CM | POA: Diagnosis not present

## 2021-06-05 DIAGNOSIS — I10 Essential (primary) hypertension: Secondary | ICD-10-CM

## 2021-06-05 DIAGNOSIS — F4329 Adjustment disorder with other symptoms: Secondary | ICD-10-CM | POA: Diagnosis not present

## 2021-06-05 LAB — POCT GLYCOSYLATED HEMOGLOBIN (HGB A1C): Hemoglobin A1C: 6.1 % — AB (ref 4.0–5.6)

## 2021-06-05 MED ORDER — AMLODIPINE BESYLATE 5 MG PO TABS: 5.0000 mg | ORAL_TABLET | Freq: Every day | ORAL | 1 refills | Status: DC

## 2021-06-05 MED ORDER — TIRZEPATIDE 5 MG/0.5ML ~~LOC~~ SOAJ: 5.0000 mg | SUBCUTANEOUS | 0 refills | Status: DC

## 2021-06-05 MED ORDER — METOPROLOL SUCCINATE ER 100 MG PO TB24: 100.0000 mg | ORAL_TABLET | Freq: Every day | ORAL | 1 refills | Status: DC

## 2021-06-05 MED ORDER — TIRZEPATIDE 2.5 MG/0.5ML ~~LOC~~ SOAJ: 2.5000 mg | SUBCUTANEOUS | 0 refills | Status: DC

## 2021-06-05 NOTE — Progress Notes (Signed)
Established Patient Office Visit  Subjective:  Patient ID: Deborah Carrillo, female    DOB: May 09, 1981  Age: 40 y.o. MRN: [id number]  CC:  Chief Complaint  Patient presents with   Hypertension   Diabetes     HPI Deborah Carrillo presents for   Hypertension- Pt denies chest pain, SOB, dizziness, or heart palpitations.  Taking meds as directed w/o problems.  Denies medication side effects.    Impaired fasting glucose-no increased thirst or urination. No symptoms consistent with hypoglycemia.  She also reports that she has been under a lot of stress recently as she just had significant dental work done she had the upper teeth pulled and she is having implants placed.  She just got served her final divorce papers in the last couple of weeks.  And now she is looking for an apartment to live in.  She says also the recent passing of her mom has been hard as well.  She looks forward to going to work because it just keeps her busy and keeps her distracted.  But she does report feeling down or little interest in doing things more than half the days she has not been spending time with friends in fact she has been sort of pushing them away she does feel nervous and anxious about things nearly every day.  She has not had any thoughts of wanting to harm herself.  Past Medical History:  Diagnosis Date   Acute depression 06/15/2019   Essential hypertension 09/24/2012   Hyperlipidemia 12/28/2016   Polycystic ovary disease    Primary osteoarthritis of left knee 06/21/2017   Tobacco abuse     Past Surgical History:  Procedure Laterality Date   CESAREAN SECTION  2001   CHOLECYSTECTOMY  2020   TYMPANOPLASTY  1988    Family History  Problem Relation Age of Onset   Diabetes Maternal Grandmother    Hypothyroidism Mother    Lung cancer Mother    Diabetes Mother     Social History   Socioeconomic History   Marital status: Legally Separated    Spouse name: Not on file   Number of children: Not on  file   Years of education: Not on file   Highest education level: Not on file  Occupational History   Occupation: Merchant navy officer: [employer]  Tobacco Use   Smoking status: Former    Packs/day: 0.50    Years: 14.00    Pack years: 7.00    Types: Cigarettes    Quit date: 03/03/2018    Years since quitting: 3.2   Smokeless tobacco: Never  Substance and Sexual Activity   Alcohol use: No   Drug use: No   Sexual activity: Yes    Partners: Male  Other Topics Concern   Not on file  Social History Narrative   Not on file   Social Determinants of Health   Financial Resource Strain: Not on file  Food Insecurity: Not on file  Transportation Needs: Not on file  Physical Activity: Not on file  Stress: Not on file  Social Connections: Not on file  Intimate Partner Violence: Not on file    Outpatient Medications Prior to Visit  Medication Sig Dispense Refill   Norgestimate-Ethinyl Estradiol Triphasic (TRI-ESTARYLLA) 0.18/0.215/0.25 MG-35 MCG tablet Take 1 tablet by mouth daily. 28 tablet 11   amLODipine (NORVASC) 5 MG tablet TAKE 1 TABLET BY MOUTH EVERY DAY 30 tablet 5   metoprolol succinate (TOPROL-XL) 100 MG 24 hr tablet  Take 1 tablet (100 mg total) by mouth daily. 30 day supply given. Please call to schedule and keep appointment for future refills 30 tablet 0   TRULICITY 0.75 MG/0.5ML SOPN INJECT 0.75 MG INTO THE SKIN ONCE A WEEK. 2 mL 0   traMADol (ULTRAM) 50 MG tablet Take 1-2 tablets (50-100 mg total) by mouth every 8 (eight) hours as needed for moderate pain. Maximum 6 tabs per day. 21 tablet 0   triamcinolone cream (KENALOG) 0.1 % Apply 1 application topically 2 (two) times daily. 30 g 0   No facility-administered medications prior to visit.    Allergies  Allergen Reactions   Lisinopril     Angioedema   Metformin And Related Other (See Comments)    GI upset   Victoza [Liraglutide] Other (See Comments)    Soreness and bruising   Amoxicillin     Erythromycin    Penicillins     ROS Review of Systems    Objective:    Physical Exam Constitutional:      Appearance: Normal appearance. She is well-developed.  HENT:     Head: Normocephalic and atraumatic.  Cardiovascular:     Rate and Rhythm: Normal rate and regular rhythm.     Heart sounds: Normal heart sounds.  Pulmonary:     Effort: Pulmonary effort is normal.     Breath sounds: Normal breath sounds.  Skin:    General: Skin is warm and dry.  Neurological:     Mental Status: She is alert and oriented to person, place, and time.  Psychiatric:        Behavior: Behavior normal.    BP (!) 142/74   Pulse 64   Ht 5\' 3"  (1.6 m)   Wt (!) 313 lb (142 kg)   SpO2 100%   BMI 55.45 kg/m  Wt Readings from Last 3 Encounters:  06/05/21 (!) 313 lb (142 kg)  02/02/21 (!) 305 lb (138.3 kg)  06/02/20 287 lb (130.2 kg)     Health Maintenance Due  Topic Date Due   Hepatitis C Screening  Never done   FOOT EXAM  06/02/2021    There are no preventive care reminders to display for this patient.  Lab Results  Component Value Date   TSH 1.72 06/15/2019   Lab Results  Component Value Date   WBC 7.6 02/02/2021   HGB 11.7 02/02/2021   HCT 35.4 02/02/2021   MCV 76.6 (L) 02/02/2021   PLT 291 02/02/2021   Lab Results  Component Value Date   NA 139 02/02/2021   K 3.9 02/02/2021   CO2 28 02/02/2021   GLUCOSE 120 (H) 02/02/2021   BUN 10 02/02/2021   CREATININE 0.83 02/02/2021   BILITOT 0.3 02/02/2021   ALKPHOS 66 12/25/2016   AST 12 02/02/2021   ALT 13 02/02/2021   PROT 6.9 02/02/2021   ALBUMIN 3.5 (L) 12/25/2016   CALCIUM 9.0 02/02/2021   Lab Results  Component Value Date   CHOL 206 (H) 02/02/2021   Lab Results  Component Value Date   HDL 64 02/02/2021   Lab Results  Component Value Date   LDLCALC 117 (H) 02/02/2021   Lab Results  Component Value Date   TRIG 133 02/02/2021   Lab Results  Component Value Date   CHOLHDL 3.2 02/02/2021   Lab Results   Component Value Date   HGBA1C 6.1 (A) 06/05/2021      Assessment & Plan:   Problem List Items Addressed This Visit  Cardiovascular and Mediastinum   Essential hypertension - Primary (Chronic)    BP better after repeat check but still not at goal.  We will recheck this again last year in 3 months if not at goal at that point in time we will increase amlodipine to 10 mg.      Relevant Medications   metoprolol succinate (TOPROL-XL) 100 MG 24 hr tablet   amLODipine (NORVASC) 5 MG tablet     Endocrine   Controlled diabetes mellitus type 2 with complications, unspecified whether long term insulin use (HCC)    A1c looks good today at 6.1.  But she would like to consider switching from Trulicity to [facility] which is one of the newer medications in that class that little bit more for powerful for weight loss she says she is just frustrated that she has gained back a fair amount of weight after working really hard to get her weight down. Coupon card given.       Relevant Medications   tirzepatide (MOUNJARO) 2.5 MG/0.5ML Pen   tirzepatide (MOUNJARO) 5 MG/0.5ML Pen   Other Relevant Orders   POCT glycosylated hemoglobin (Hb A1C) (Completed)     Other   Stress and adjustment reaction    Discussed considering therapy or counseling.  She says she is just not sure she is ready to start being open and to talk about it quite yet.  Did encourage her to look into IEP through work that offers free counseling with a limited number of sessions she could maybe at least try it and see if she feels like it is helpful to maybe work with someone a little bit more long-term.      BMI 50.0-59.9, adult (HCC)    Try switching to [facility], since more more powerful medication for weight loss.  Continue to work on [employer] and staying active..      Relevant Medications   tirzepatide (MOUNJARO) 2.5 MG/0.5ML Pen   tirzepatide (MOUNJARO) 5 MG/0.5ML Pen    Meds ordered this encounter   Medications   tirzepatide (MOUNJARO) 2.5 MG/0.5ML Pen    Sig: Inject 2.5 mg into the skin once a week. X 4 weeks    Dispense:  2 mL    Refill:  0   tirzepatide (MOUNJARO) 5 MG/0.5ML Pen    Sig: Inject 5 mg into the skin once a week. Start after complete 1 mo of 2.5mg  dose    Dispense:  2 mL    Refill:  0   metoprolol succinate (TOPROL-XL) 100 MG 24 hr tablet    Sig: Take 1 tablet (100 mg total) by mouth daily.    Dispense:  90 tablet    Refill:  1   amLODipine (NORVASC) 5 MG tablet    Sig: Take 1 tablet (5 mg total) by mouth daily.    Dispense:  90 tablet    Refill:  1     Follow-up: Return in about 3 months (around 09/04/2021) for Diabetes follow-up.    Nani Gasser, MD

## 2021-06-05 NOTE — Assessment & Plan Note (Signed)
A1c looks good today at 6.1.  But she would like to consider switching from Trulicity to [facility] which is one of the newer medications in that class that little bit more for powerful for weight loss she says she is just frustrated that she has gained back a fair amount of weight after working really hard to get her weight down. Coupon card given.

## 2021-06-05 NOTE — Assessment & Plan Note (Signed)
BP better after repeat check but still not at goal.  We will recheck this again last year in 3 months if not at goal at that point in time we will increase amlodipine to 10 mg.

## 2021-06-05 NOTE — Assessment & Plan Note (Signed)
Try switching to [facility], since more more powerful medication for weight loss.  Continue to work on [employer] and staying active.Marland Kitchen

## 2021-06-05 NOTE — Assessment & Plan Note (Signed)
Discussed considering therapy or counseling.  She says she is just not sure she is ready to start being open and to talk about it quite yet.  Did encourage her to look into IEP through work that offers free counseling with a limited number of sessions she could maybe at least try it and see if she feels like it is helpful to maybe work with someone a little bit more long-term.

## 2021-06-19 ENCOUNTER — Other Ambulatory Visit: Payer: Self-pay | Admitting: Family Medicine

## 2021-06-19 DIAGNOSIS — E119 Type 2 diabetes mellitus without complications: Secondary | ICD-10-CM

## 2021-08-03 ENCOUNTER — Other Ambulatory Visit: Payer: Self-pay | Admitting: Family Medicine

## 2021-08-03 DIAGNOSIS — E118 Type 2 diabetes mellitus with unspecified complications: Secondary | ICD-10-CM

## 2021-08-04 ENCOUNTER — Other Ambulatory Visit: Payer: Self-pay | Admitting: *Deleted

## 2021-08-18 ENCOUNTER — Encounter: Payer: Self-pay | Admitting: Family Medicine

## 2021-08-18 DIAGNOSIS — E118 Type 2 diabetes mellitus with unspecified complications: Secondary | ICD-10-CM

## 2021-08-18 MED ORDER — TIRZEPATIDE 7.5 MG/0.5ML ~~LOC~~ SOAJ: 7.5000 mg | SUBCUTANEOUS | 0 refills | Status: DC

## 2021-08-31 ENCOUNTER — Encounter: Payer: Self-pay | Admitting: Family Medicine

## 2021-08-31 DIAGNOSIS — E118 Type 2 diabetes mellitus with unspecified complications: Secondary | ICD-10-CM

## 2021-08-31 MED ORDER — TIRZEPATIDE 5 MG/0.5ML ~~LOC~~ SOAJ: 5.0000 mg | SUBCUTANEOUS | 0 refills | Status: DC

## 2021-09-04 ENCOUNTER — Other Ambulatory Visit: Payer: Self-pay

## 2021-09-04 ENCOUNTER — Ambulatory Visit (INDEPENDENT_AMBULATORY_CARE_PROVIDER_SITE_OTHER): Payer: Managed Care, Other (non HMO) | Admitting: Family Medicine

## 2021-09-04 ENCOUNTER — Encounter: Payer: Self-pay | Admitting: Family Medicine

## 2021-09-04 VITALS — BP 131/91 | HR 76 | Ht 63.0 in | Wt 299.0 lb

## 2021-09-04 DIAGNOSIS — Z6841 Body Mass Index (BMI) 40.0 and over, adult: Secondary | ICD-10-CM

## 2021-09-04 DIAGNOSIS — F4329 Adjustment disorder with other symptoms: Secondary | ICD-10-CM

## 2021-09-04 DIAGNOSIS — I10 Essential (primary) hypertension: Secondary | ICD-10-CM | POA: Diagnosis not present

## 2021-09-04 DIAGNOSIS — E118 Type 2 diabetes mellitus with unspecified complications: Secondary | ICD-10-CM

## 2021-09-04 LAB — POCT GLYCOSYLATED HEMOGLOBIN (HGB A1C): Hemoglobin A1C: 5.6 % (ref 4.0–5.6)

## 2021-09-04 NOTE — Assessment & Plan Note (Signed)
Weight down to 299 which is fantastic.  She is doing spectacular just gave her encouragement today.

## 2021-09-04 NOTE — Assessment & Plan Note (Addendum)
Well controlled.  A1c down to 5.6 today which is phenomenal.  Her weight is down significantly as well.  She is lost about 14 pounds since she was here in September.  Continue current regimen.  She will go up to the 7.5 mg on the [employer]gg[employer] Keen is soon as it is available at the pharmacy.  Follow up in  3 months.

## 2021-09-04 NOTE — Assessment & Plan Note (Signed)
Diastolic pressure is a little bit elevated today which is a little unusual.  We did recheck it and it was still a little elevated.  Unclear etiology.  But we will keep an eye on it she is really doing great with her weight loss over the actually expect her blood pressure to be decreasing.  We will check it again at next visit and if still not improving then we may need to adjust her regimen she is already on metoprolol and amlodipine.  Consider the addition of a diuretic.  She had angioedema with lisinopril.

## 2021-09-04 NOTE — Assessment & Plan Note (Addendum)
Has been a little bit stressful but overall she is doing okay she is planning on taking a few days off at the new year.  They have been short staffed.  Did encourage her to really make sure she is taking time for herself to do some self-care and to stay active and do some walking.

## 2021-09-04 NOTE — Progress Notes (Signed)
Established Patient Office Visit  Subjective:  Patient ID: Deborah Carrillo, female    DOB: Nov 02, 1980  Age: 40 y.o. MRN: [id number]  CC:  Chief Complaint  Patient presents with   Diabetes   Hypertension    HPI Deborah Carrillo presents for   Hypertension- Pt denies chest pain, SOB, dizziness, or heart palpitations.  Taking meds as directed w/o problems.  Denies medication side effects.    Diabetes - no hypoglycemic events. No wounds or sores that are not healing well. No increased thirst or urination. Checking glucose at home. Taking medications as prescribed without any side effects.  She is currently Amongero 5 mg and doing well.  She was supposed to taper up to the 7.5 mg a couple of weeks ago but then today did not have it in stock so she is just been sticking with a 5 mg for now. She is down 14 lbs in 3 months.     Past Medical History:  Diagnosis Date   Acute depression 06/15/2019   Essential hypertension 09/24/2012   Hyperlipidemia 12/28/2016   Polycystic ovary disease    Primary osteoarthritis of left knee 06/21/2017   Tobacco abuse     Past Surgical History:  Procedure Laterality Date   CESAREAN SECTION  2001   CHOLECYSTECTOMY  2020   TYMPANOPLASTY  1988    Family History  Problem Relation Age of Onset   Diabetes Maternal Grandmother    Hypothyroidism Mother    Lung cancer Mother    Diabetes Mother     Social History   Socioeconomic History   Marital status: Divorced    Spouse name: Not on file   Number of children: Not on file   Years of education: Not on file   Highest education level: Not on file  Occupational History   Occupation: Merchant navy officer: [employer]  Tobacco Use   Smoking status: Former    Packs/day: 0.50    Years: 14.00    Pack years: 7.00    Types: Cigarettes    Quit date: 03/03/2018    Years since quitting: 3.5   Smokeless tobacco: Never  Substance and Sexual Activity   Alcohol use: No   Drug use: No   Sexual  activity: Yes    Partners: Male  Other Topics Concern   Not on file  Social History Narrative   Not on file   Social Determinants of Health   Financial Resource Strain: Not on file  Food Insecurity: Not on file  Transportation Needs: Not on file  Physical Activity: Not on file  Stress: Not on file  Social Connections: Not on file  Intimate Partner Violence: Not on file    Outpatient Medications Prior to Visit  Medication Sig Dispense Refill   amLODipine (NORVASC) 5 MG tablet Take 1 tablet (5 mg total) by mouth daily. 90 tablet 1   metoprolol succinate (TOPROL-XL) 100 MG 24 hr tablet Take 1 tablet (100 mg total) by mouth daily. 90 tablet 1   Norgestimate-Ethinyl Estradiol Triphasic (TRI-ESTARYLLA) 0.18/0.215/0.25 MG-35 MCG tablet Take 1 tablet by mouth daily. 28 tablet 11   tirzepatide (MOUNJARO) 5 MG/0.5ML Pen Inject 5 mg into the skin once a week. Start after complete 1 mo of 2.5mg  dose 2 mL 0   tirzepatide (MOUNJARO) 7.5 MG/0.5ML Pen Inject 7.5 mg into the skin once a week. (Patient not taking: Reported on 09/04/2021) 2 mL 0   tirzepatide (MOUNJARO) 5 MG/0.5ML Pen Inject 5 mg into the  skin once a week. 2 mL 1   triamcinolone cream (KENALOG) 0.1 % APPLY TO AFFECTED AREA TWICE A DAY 30 g 0   No facility-administered medications prior to visit.    Allergies  Allergen Reactions   Lisinopril     Angioedema   Metformin And Related Other (See Comments)    GI upset   Victoza [Liraglutide] Other (See Comments)    Soreness and bruising   Amoxicillin    Erythromycin    Penicillins     ROS Review of Systems    Objective:    Physical Exam Constitutional:      Appearance: Normal appearance. She is well-developed.  HENT:     Head: Normocephalic and atraumatic.  Cardiovascular:     Rate and Rhythm: Normal rate and regular rhythm.     Heart sounds: Normal heart sounds.  Pulmonary:     Effort: Pulmonary effort is normal.     Breath sounds: Normal breath sounds.  Skin:     General: Skin is warm and dry.  Neurological:     Mental Status: She is alert and oriented to person, place, and time.  Psychiatric:        Behavior: Behavior normal.   BP (!) 131/91    Pulse 76    Ht 5\' 3"  (1.6 m)    Wt 299 lb (135.6 kg)    SpO2 100%    BMI 52.97 kg/m  Wt Readings from Last 3 Encounters:  09/04/21 299 lb (135.6 kg)  06/05/21 (!) 313 lb (142 kg)  02/02/21 (!) 305 lb (138.3 kg)     Health Maintenance Due  Topic Date Due   Hepatitis C Screening  Never done   Pneumococcal Vaccine 81-70 Years old (2 - PCV) 10/25/2019    There are no preventive care reminders to display for this patient.  Lab Results  Component Value Date   TSH 1.72 06/15/2019   Lab Results  Component Value Date   WBC 7.6 02/02/2021   HGB 11.7 02/02/2021   HCT 35.4 02/02/2021   MCV 76.6 (L) 02/02/2021   PLT 291 02/02/2021   Lab Results  Component Value Date   NA 139 02/02/2021   K 3.9 02/02/2021   CO2 28 02/02/2021   GLUCOSE 120 (H) 02/02/2021   BUN 10 02/02/2021   CREATININE 0.83 02/02/2021   BILITOT 0.3 02/02/2021   ALKPHOS 66 12/25/2016   AST 12 02/02/2021   ALT 13 02/02/2021   PROT 6.9 02/02/2021   ALBUMIN 3.5 (L) 12/25/2016   CALCIUM 9.0 02/02/2021   Lab Results  Component Value Date   CHOL 206 (H) 02/02/2021   Lab Results  Component Value Date   HDL 64 02/02/2021   Lab Results  Component Value Date   LDLCALC 117 (H) 02/02/2021   Lab Results  Component Value Date   TRIG 133 02/02/2021   Lab Results  Component Value Date   CHOLHDL 3.2 02/02/2021   Lab Results  Component Value Date   HGBA1C 5.6 09/04/2021      Assessment & Plan:   Problem List Items Addressed This Visit       Cardiovascular and Mediastinum   Essential hypertension (Chronic)    Diastolic pressure is a little bit elevated today which is a little unusual.  We did recheck it and it was still a little elevated.  Unclear etiology.  But we will keep an eye on it she is really doing great with  her weight loss over the actually  expect her blood pressure to be decreasing.  We will check it again at next visit and if still not improving then we may need to adjust her regimen she is already on metoprolol and amlodipine.  Consider the addition of a diuretic.  She had angioedema with lisinopril.        Endocrine   Controlled diabetes mellitus type 2 with complications, unspecified whether long term insulin use (HCC) - Primary    Well controlled.  A1c down to 5.6 today which is phenomenal.  Her weight is down significantly as well.  She is lost about 14 pounds since she was here in September.  Continue current regimen.  She will go up to the 7.5 mg on the [employer] is soon as it is available at the pharmacy.  Follow up in  3 months.       Relevant Orders   POCT glycosylated hemoglobin (Hb A1C) (Completed)     Other   Stress and adjustment reaction    Has been a little bit stressful but overall she is doing okay she is planning on taking a few days off at the new year.  They have been short staffed.  Did encourage her to really make sure she is taking time for herself to do some self-care and to stay active and do some walking.      BMI 50.0-59.9, adult (HCC)    Weight down to 299 which is fantastic.  She is doing spectacular just gave her encouragement today.       No orders of the defined types were placed in this encounter.   Follow-up: Return in about 3 months (around 12/03/2021) for Diabetes follow-up.    Nani Gasser, MD

## 2021-09-22 ENCOUNTER — Other Ambulatory Visit: Payer: Self-pay | Admitting: Family Medicine

## 2021-09-22 ENCOUNTER — Other Ambulatory Visit: Payer: Self-pay | Admitting: *Deleted

## 2021-10-02 ENCOUNTER — Other Ambulatory Visit: Payer: Self-pay | Admitting: Family Medicine

## 2021-10-30 ENCOUNTER — Telehealth: Payer: Self-pay | Admitting: General Practice

## 2021-10-30 NOTE — Telephone Encounter (Signed)
Transition Care Management Unsuccessful Follow-up Telephone Call  Date of discharge and from where:  10/29/21 from [facility]  Attempts:  1st Attempt  Reason for unsuccessful TCM follow-up call:  Left voice message

## 2021-11-01 NOTE — Telephone Encounter (Signed)
Transition Care Management Unsuccessful Follow-up Telephone Call  Date of discharge and from where:  10/29/21 from [facility]  Attempts:  2nd Attempt  Reason for unsuccessful TCM follow-up call:  Left voice message

## 2021-11-03 LAB — HM MAMMOGRAPHY

## 2021-11-03 NOTE — Telephone Encounter (Signed)
Transition Care Management Unsuccessful Follow-up Telephone Call  Date of discharge and from where:  10/29/21 from [facility]  Attempts:  3rd Attempt  Reason for unsuccessful TCM follow-up call:  Left voice message

## 2021-11-13 ENCOUNTER — Encounter: Payer: Self-pay | Admitting: Family Medicine

## 2021-11-14 MED ORDER — TIRZEPATIDE 10 MG/0.5ML ~~LOC~~ SOAJ: 10.0000 mg | SUBCUTANEOUS | 1 refills | Status: DC

## 2021-11-14 NOTE — Telephone Encounter (Signed)
Meds ordered this encounter  Medications   tirzepatide (MOUNJARO) 10 MG/0.5ML Pen    Sig: Inject 10 mg into the skin once a week.    Dispense:  4 mL    Refill:  1

## 2021-12-04 ENCOUNTER — Ambulatory Visit: Payer: Managed Care, Other (non HMO) | Admitting: Family Medicine

## 2021-12-04 ENCOUNTER — Encounter: Payer: Self-pay | Admitting: Family Medicine

## 2021-12-04 ENCOUNTER — Other Ambulatory Visit: Payer: Self-pay

## 2021-12-04 VITALS — BP 138/70 | HR 83 | Resp 18 | Ht 63.0 in | Wt 286.0 lb

## 2021-12-04 DIAGNOSIS — Z6841 Body Mass Index (BMI) 40.0 and over, adult: Secondary | ICD-10-CM

## 2021-12-04 DIAGNOSIS — E118 Type 2 diabetes mellitus with unspecified complications: Secondary | ICD-10-CM

## 2021-12-04 DIAGNOSIS — I10 Essential (primary) hypertension: Secondary | ICD-10-CM | POA: Diagnosis not present

## 2021-12-04 LAB — POCT GLYCOSYLATED HEMOGLOBIN (HGB A1C): Hemoglobin A1C: 5.5 % (ref 4.0–5.6)

## 2021-12-04 MED ORDER — AMLODIPINE BESYLATE 5 MG PO TABS: 5.0000 mg | ORAL_TABLET | Freq: Every day | ORAL | 3 refills | Status: DC

## 2021-12-04 MED ORDER — METOPROLOL SUCCINATE ER 100 MG PO TB24: 100.0000 mg | ORAL_TABLET | Freq: Every day | ORAL | 3 refills | Status: DC

## 2021-12-04 NOTE — Progress Notes (Signed)
? ?Established Patient Office Visit ? ?Subjective:  ?Patient ID: Deborah Carrillo, female    DOB: 1980/12/28  Age: 40 y.o. MRN: [id number] ? ?CC:  ?Chief Complaint  ?Patient presents with  ? Diabetes  ?  Follow up   ? Diabetes Eye Exam  ?  Patient will schedule appointment   ? ? ?HPI ?Deborah Carrillo presents for  ? ?Hypertension- Pt denies chest pain, SOB, dizziness, or heart palpitations.  Taking meds as directed w/o problems.  Denies medication side effects.   ? ?Diabetes - no hypoglycemic events. No wounds or sores that are not healing well. No increased thirst or urination. Checking glucose Deborah home. Taking medications as prescribed without any side effects.  On 10 mg of Mounjaro.  She is down 13 pounds since she was last here in December which is fantastic. ? ?She is selling her house after her separation from her husband. Hopefully will be buying a new house later this summer.   ? ?Past Medical History:  ?Diagnosis Date  ? Acute depression 06/15/2019  ? Essential hypertension 09/24/2012  ? Hyperlipidemia 12/28/2016  ? Polycystic ovary disease   ? Primary osteoarthritis of left knee 06/21/2017  ? Tobacco abuse   ? ? ?Past Surgical History:  ?Procedure Laterality Date  ? CESAREAN SECTION  2001  ? CHOLECYSTECTOMY  2020  ? TYMPANOPLASTY  1988  ? ? ?Family History  ?Problem Relation Age of Onset  ? Diabetes Maternal Grandmother   ? Hypothyroidism Mother   ? Lung cancer Mother   ? Diabetes Mother   ? ? ?Social History  ? ?Socioeconomic History  ? Marital status: Divorced  ?  Spouse name: Not on file  ? Number of children: Not on file  ? Years of education: Not on file  ? Highest education level: Not on file  ?Occupational History  ? Occupation: Hotel manager  ?  Employer: [employer]  ?Tobacco Use  ? Smoking status: Former  ?  Packs/day: 0.50  ?  Years: 14.00  ?  Pack years: 7.00  ?  Types: Cigarettes  ?  Quit date: 03/03/2018  ?  Years since quitting: 3.7  ? Smokeless tobacco: Never  ?Substance and Sexual Activity  ?  Alcohol use: No  ? Drug use: No  ? Sexual activity: Yes  ?  Partners: Male  ?Other Topics Concern  ? Not on file  ?Social History Narrative  ? Not on file  ? ?Social Determinants of Health  ? ?Financial Resource Strain: Not on file  ?Food Insecurity: Not on file  ?Transportation Needs: Not on file  ?Physical Activity: Not on file  ?Stress: Not on file  ?Social Connections: Not on file  ?Intimate Partner Violence: Not on file  ? ? ?Outpatient Medications Prior to Visit  ?Medication Sig Dispense Refill  ? tirzepatide (MOUNJARO) 10 MG/0.5ML Pen Inject 10 mg into the skin once a week. 4 mL 1  ? TRI-ESTARYLLA 0.18/0.215/0.25 MG-35 MCG tablet TAKE 1 TABLET BY MOUTH EVERY DAY 28 tablet 3  ? amLODipine (NORVASC) 5 MG tablet Take 1 tablet (5 mg total) by mouth daily. 90 tablet 1  ? metoprolol succinate (TOPROL-XL) 100 MG 24 hr tablet Take 1 tablet (100 mg total) by mouth daily. 90 tablet 1  ? ?No facility-administered medications prior to visit.  ? ? ?Allergies  ?Allergen Reactions  ? Lisinopril   ?  Angioedema  ? Metformin And Related Other (See Comments)  ?  GI upset  ? Victoza [Liraglutide] Other (See Comments)  ?  Soreness and bruising  ? Amoxicillin   ? Erythromycin   ? Penicillins   ? ? ?ROS ?Review of Systems ? ?  ?Objective:  ?  ?Physical Exam ?Constitutional:   ?   Appearance: Normal appearance. She is well-developed.  ?HENT:  ?   Head: Normocephalic and atraumatic.  ?Cardiovascular:  ?   Rate and Rhythm: Normal rate and regular rhythm.  ?   Heart sounds: Normal heart sounds.  ?Pulmonary:  ?   Effort: Pulmonary effort is normal.  ?   Breath sounds: Normal breath sounds.  ?Skin: ?   General: Skin is warm and dry.  ?Neurological:  ?   Mental Status: She is alert and oriented to person, place, and time.  ?Psychiatric:     ?   Behavior: Behavior normal.  ? ? ?BP 138/70   Pulse 83   Resp 18   Ht 5\' 3"  (1.6 m)   Wt 286 lb (129.7 kg)   LMP 11/13/2021   SpO2 100%   BMI 50.66 kg/m?  ?Wt Readings from Last 3  Encounters:  ?12/04/21 286 lb (129.7 kg)  ?09/04/21 299 lb (135.6 kg)  ?06/05/21 (!) 313 lb (142 kg)  ? ? ? ?Health Maintenance Due  ?Topic Date Due  ? Hepatitis C Screening  Never done  ? URINE MICROALBUMIN  02/02/2022  ? ? ?There are no preventive care reminders to display for this patient. ? ?Lab Results  ?Component Value Date  ? TSH 1.72 06/15/2019  ? ?Lab Results  ?Component Value Date  ? WBC 7.6 02/02/2021  ? HGB 11.7 02/02/2021  ? HCT 35.4 02/02/2021  ? MCV 76.6 (L) 02/02/2021  ? PLT 291 02/02/2021  ? ?Lab Results  ?Component Value Date  ? NA 139 02/02/2021  ? K 3.9 02/02/2021  ? CO2 28 02/02/2021  ? GLUCOSE 120 (H) 02/02/2021  ? BUN 10 02/02/2021  ? CREATININE 0.83 02/02/2021  ? BILITOT 0.3 02/02/2021  ? ALKPHOS 66 12/25/2016  ? AST 12 02/02/2021  ? ALT 13 02/02/2021  ? PROT 6.9 02/02/2021  ? ALBUMIN 3.5 (L) 12/25/2016  ? CALCIUM 9.0 02/02/2021  ? ?Lab Results  ?Component Value Date  ? CHOL 206 (H) 02/02/2021  ? ?Lab Results  ?Component Value Date  ? HDL 64 02/02/2021  ? ?Lab Results  ?Component Value Date  ? LDLCALC 117 (H) 02/02/2021  ? ?Lab Results  ?Component Value Date  ? TRIG 133 02/02/2021  ? ?Lab Results  ?Component Value Date  ? CHOLHDL 3.2 02/02/2021  ? ?Lab Results  ?Component Value Date  ? HGBA1C 5.5 12/04/2021  ? ? ?  ?Assessment & Plan:  ? ?Problem List Items Addressed This Visit   ? ?  ? Cardiovascular and Mediastinum  ? Essential hypertension - Primary (Chronic)  ?  Well controlled. Continue current regimen. Follow up in  3 months.  ?  ?  ? Relevant Medications  ? amLODipine (NORVASC) 5 MG tablet  ? metoprolol succinate (TOPROL-XL) 100 MG 24 hr tablet  ?  ? Endocrine  ? Controlled diabetes mellitus type 2 with complications, unspecified whether long term insulin use ([place])  ?  Well controlled. Continue current regimen. Follow up in  3 mo. Due for labs in May. She is doing great!!!! Down 13 lbs.   ?  ?  ? Relevant Orders  ? POCT glycosylated hemoglobin (Hb A1C) (Completed)  ?  ? Other  ? BMI  50.0-59.9, adult ([place])  ?  She is doing well!  Continue  to work on portion control and regular exercise. Discussed setting goals for exercise of the next month.   ?  ?  ? ? ?Meds ordered this encounter  ?Medications  ? amLODipine (NORVASC) 5 MG tablet  ?  Sig: Take 1 tablet (5 mg total) by mouth daily.  ?  Dispense:  90 tablet  ?  Refill:  3  ? metoprolol succinate (TOPROL-XL) 100 MG 24 hr tablet  ?  Sig: Take 1 tablet (100 mg total) by mouth daily.  ?  Dispense:  90 tablet  ?  Refill:  3  ? ? ?Follow-up: Return in about 3 months (around 03/06/2022) for Diabetes follow-up and labs. .  ? ? ?Beatrice Lecher, MD ?

## 2021-12-04 NOTE — Assessment & Plan Note (Signed)
She is doing well!  Continue to work on portion control and regular exercise. Discussed setting goals for exercise of the next month.   ?

## 2021-12-04 NOTE — Assessment & Plan Note (Signed)
Well controlled. Continue current regimen. Follow up in  3 months.  

## 2021-12-04 NOTE — Assessment & Plan Note (Signed)
Well controlled. Continue current regimen. Follow up in  3 mo. Due for labs in May. She is doing great!!!! Down 13 lbs.   ?

## 2021-12-15 ENCOUNTER — Other Ambulatory Visit: Payer: Self-pay | Admitting: Family Medicine

## 2021-12-15 DIAGNOSIS — E119 Type 2 diabetes mellitus without complications: Secondary | ICD-10-CM

## 2022-01-09 ENCOUNTER — Other Ambulatory Visit: Payer: Self-pay | Admitting: Family Medicine

## 2022-01-09 ENCOUNTER — Other Ambulatory Visit: Payer: Self-pay

## 2022-01-09 DIAGNOSIS — E282 Polycystic ovarian syndrome: Secondary | ICD-10-CM

## 2022-01-09 MED ORDER — NORGESTIM-ETH ESTRAD TRIPHASIC 0.18/0.215/0.25 MG-35 MCG PO TABS: 1.0000 | ORAL_TABLET | Freq: Every day | ORAL | 3 refills | Status: DC

## 2022-02-28 ENCOUNTER — Encounter: Payer: Self-pay | Admitting: Family Medicine

## 2022-02-28 ENCOUNTER — Encounter (INDEPENDENT_AMBULATORY_CARE_PROVIDER_SITE_OTHER): Payer: Managed Care, Other (non HMO) | Admitting: Sports Medicine

## 2022-02-28 DIAGNOSIS — M170 Bilateral primary osteoarthritis of knee: Secondary | ICD-10-CM

## 2022-02-28 MED ORDER — TIRZEPATIDE 12.5 MG/0.5ML ~~LOC~~ SOAJ: 12.5000 mg | SUBCUTANEOUS | 1 refills | Status: DC

## 2022-02-28 NOTE — Telephone Encounter (Signed)
Meds ordered this encounter  Medications   tirzepatide (MOUNJARO) 12.5 MG/0.5ML Pen    Sig: Inject 12.5 mg into the skin once a week.    Dispense:  6 mL    Refill:  1

## 2022-03-06 ENCOUNTER — Ambulatory Visit: Payer: Managed Care, Other (non HMO) | Admitting: Family Medicine

## 2022-03-12 ENCOUNTER — Encounter: Payer: Self-pay | Admitting: Family Medicine

## 2022-03-12 ENCOUNTER — Ambulatory Visit: Payer: Managed Care, Other (non HMO) | Admitting: Family Medicine

## 2022-03-12 VITALS — BP 148/67 | HR 65 | Ht 63.0 in | Wt 268.0 lb

## 2022-03-12 DIAGNOSIS — E118 Type 2 diabetes mellitus with unspecified complications: Secondary | ICD-10-CM

## 2022-03-12 DIAGNOSIS — R5383 Other fatigue: Secondary | ICD-10-CM

## 2022-03-12 DIAGNOSIS — I10 Essential (primary) hypertension: Secondary | ICD-10-CM

## 2022-03-12 DIAGNOSIS — I8392 Asymptomatic varicose veins of left lower extremity: Secondary | ICD-10-CM

## 2022-03-12 DIAGNOSIS — Z8639 Personal history of other endocrine, nutritional and metabolic disease: Secondary | ICD-10-CM | POA: Diagnosis not present

## 2022-03-12 DIAGNOSIS — R6889 Other general symptoms and signs: Secondary | ICD-10-CM

## 2022-03-12 DIAGNOSIS — K5903 Drug induced constipation: Secondary | ICD-10-CM

## 2022-03-12 LAB — POCT GLYCOSYLATED HEMOGLOBIN (HGB A1C): Hemoglobin A1C: 5.5 % (ref 4.0–5.6)

## 2022-03-12 LAB — POCT UA - MICROALBUMIN
Creatinine, POC: 300 mg/dL
Microalbumin Ur, POC: 80 mg/L

## 2022-03-12 MED ORDER — LINACLOTIDE 72 MCG PO CAPS: 72.0000 ug | ORAL_CAPSULE | Freq: Every day | ORAL | 3 refills | Status: DC

## 2022-03-12 MED ORDER — AMLODIPINE BESYLATE 10 MG PO TABS: 10.0000 mg | ORAL_TABLET | Freq: Every day | ORAL | 3 refills | Status: DC

## 2022-03-12 NOTE — Progress Notes (Signed)
She is still taking Mounjaro 10 mg until tomorrow.   Pt would like to discuss starting Linzess

## 2022-03-12 NOTE — Assessment & Plan Note (Addendum)
Not optimal.  Will inc amlodipine to 10mg .  Continue to work on healthy diet and regular exercise.  She says even her brother has high blood pressure so she thinks it may be genetic.

## 2022-03-12 NOTE — Assessment & Plan Note (Signed)
Varicose veins, discussed recommendation for compression stockings.  I think this could be helpful.  She also gets more chronic swelling in her right leg which was injured after a safe fell on it.

## 2022-03-13 LAB — COMPLETE METABOLIC PANEL WITH GFR
AG Ratio: 1.1 (calc) (ref 1.0–2.5)
ALT: 19 U/L (ref 6–29)
AST: 14 U/L (ref 10–30)
Albumin: 3.6 g/dL (ref 3.6–5.1)
Alkaline phosphatase (APISO): 53 U/L (ref 31–125)
BUN: 11 mg/dL (ref 7–25)
CO2: 23 mmol/L (ref 20–32)
Calcium: 8.5 mg/dL — ABNORMAL LOW (ref 8.6–10.2)
Chloride: 106 mmol/L (ref 98–110)
Creat: 0.82 mg/dL (ref 0.50–0.99)
Globulin: 3.4 g/dL (calc) (ref 1.9–3.7)
Glucose, Bld: 92 mg/dL (ref 65–99)
Potassium: 3.7 mmol/L (ref 3.5–5.3)
Sodium: 139 mmol/L (ref 135–146)
Total Bilirubin: 0.4 mg/dL (ref 0.2–1.2)
Total Protein: 7 g/dL (ref 6.1–8.1)
eGFR: 92 mL/min/{1.73_m2} (ref >=60)

## 2022-03-13 LAB — LIPID PANEL W/REFLEX DIRECT LDL
Cholesterol: 191 mg/dL (ref <200)
HDL: 67 mg/dL (ref >=50)
LDL Cholesterol (Calc): 98 mg/dL (calc)
Non-HDL Cholesterol (Calc): 124 mg/dL (calc) (ref <130)
Total CHOL/HDL Ratio: 2.9 (calc) (ref <5.0)
Triglycerides: 161 mg/dL — ABNORMAL HIGH (ref <150)

## 2022-03-13 LAB — CBC
HCT: 38.4 % (ref 35.0–45.0)
Hemoglobin: 12.6 g/dL (ref 11.7–15.5)
MCH: 26.8 pg — ABNORMAL LOW (ref 27.0–33.0)
MCHC: 32.8 g/dL (ref 32.0–36.0)
MCV: 81.5 fL (ref 80.0–100.0)
MPV: 10.7 fL (ref 7.5–12.5)
Platelets: 267 10*3/uL (ref 140–400)
RBC: 4.71 10*6/uL (ref 3.80–5.10)
RDW: 14.4 % (ref 11.0–15.0)
WBC: 8.2 10*3/uL (ref 3.8–10.8)

## 2022-03-13 LAB — IRON,TIBC AND FERRITIN PANEL
%SAT: 7 % (calc) — ABNORMAL LOW (ref 16–45)
Ferritin: 11 ng/mL — ABNORMAL LOW (ref 16–232)
Iron: 32 ug/dL — ABNORMAL LOW (ref 40–190)
TIBC: 441 mcg/dL (calc) (ref 250–450)

## 2022-03-13 LAB — VITAMIN B12: Vitamin B-12: 596 pg/mL (ref 200–1100)

## 2022-03-13 LAB — TSH: TSH: 3.22 mIU/L

## 2022-03-14 ENCOUNTER — Other Ambulatory Visit: Payer: Self-pay

## 2022-03-14 DIAGNOSIS — R5383 Other fatigue: Secondary | ICD-10-CM

## 2022-03-24 IMAGING — DX DG KNEE COMPLETE 4+V*L*
4 series · 4 of 4 positions shown · non-contrast
Comparison: 06/21/2017.

CLINICAL DATA: Bilateral knee pain.  No known injury.

EXAM:
LEFT KNEE - COMPLETE 4+ VIEW

[tunnel]
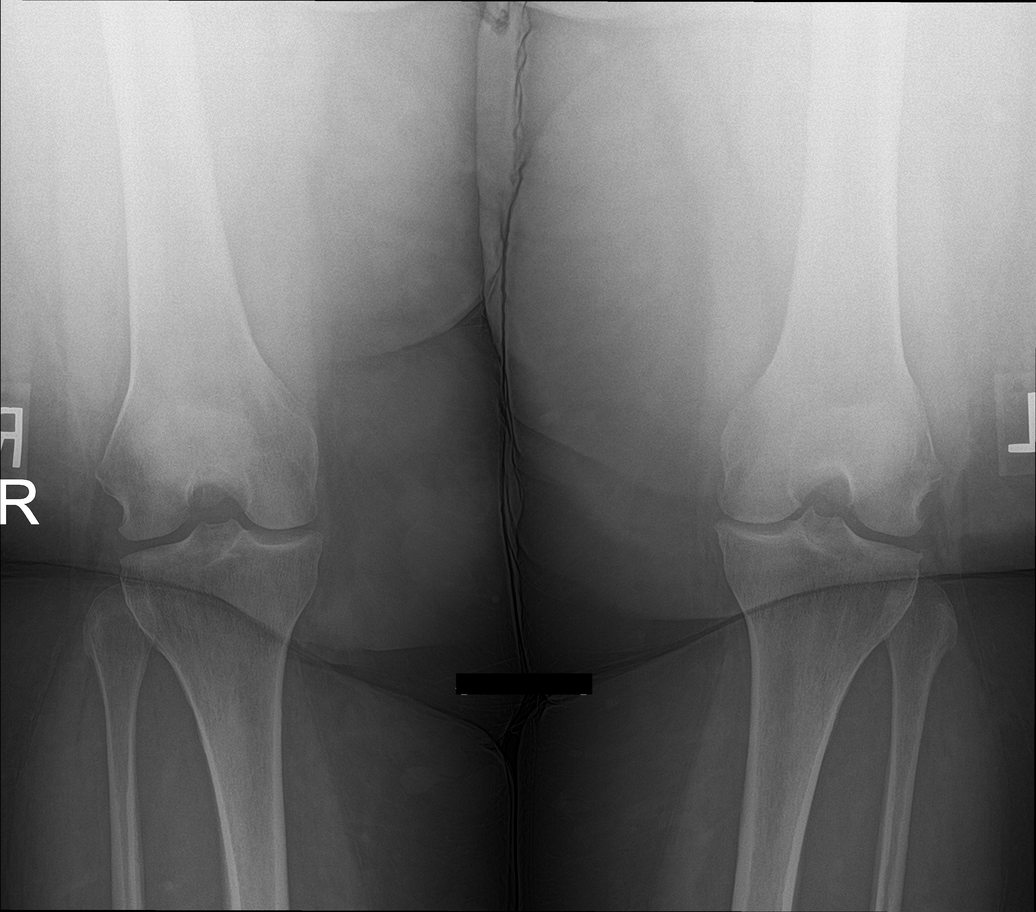

[knee lat]
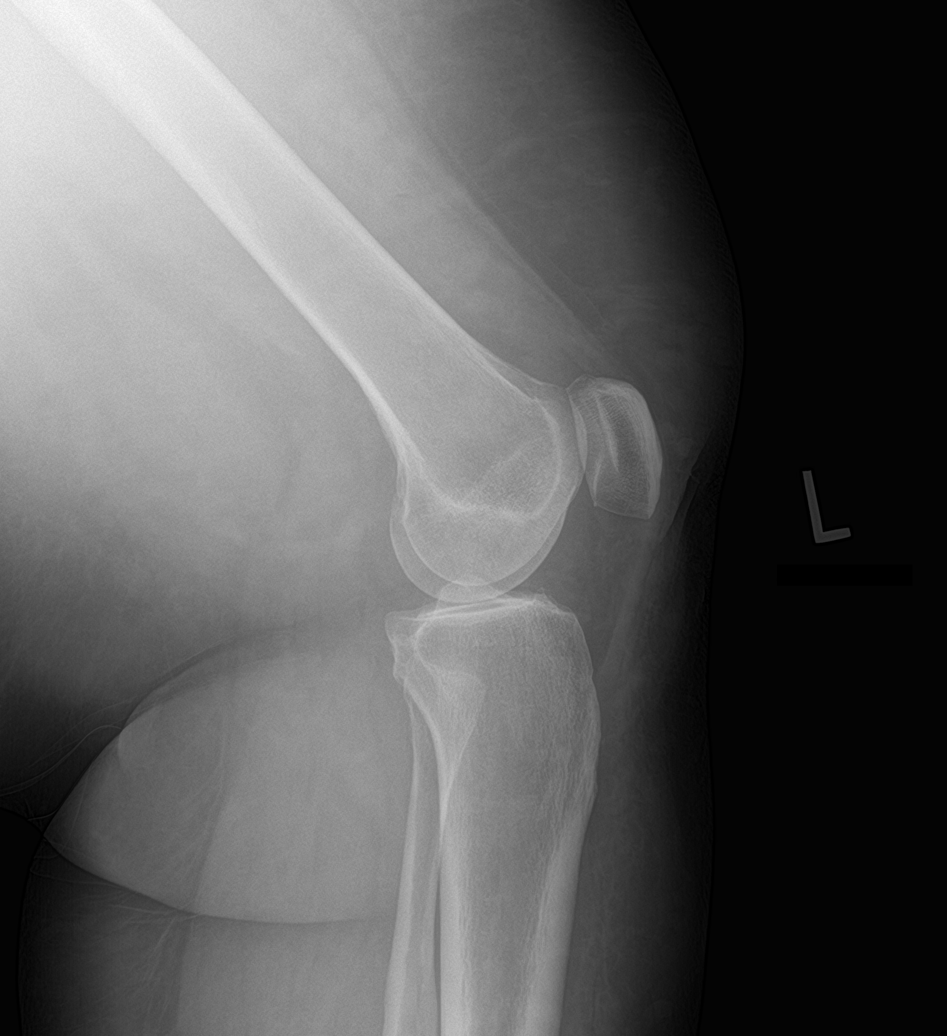

[knee sunrise]
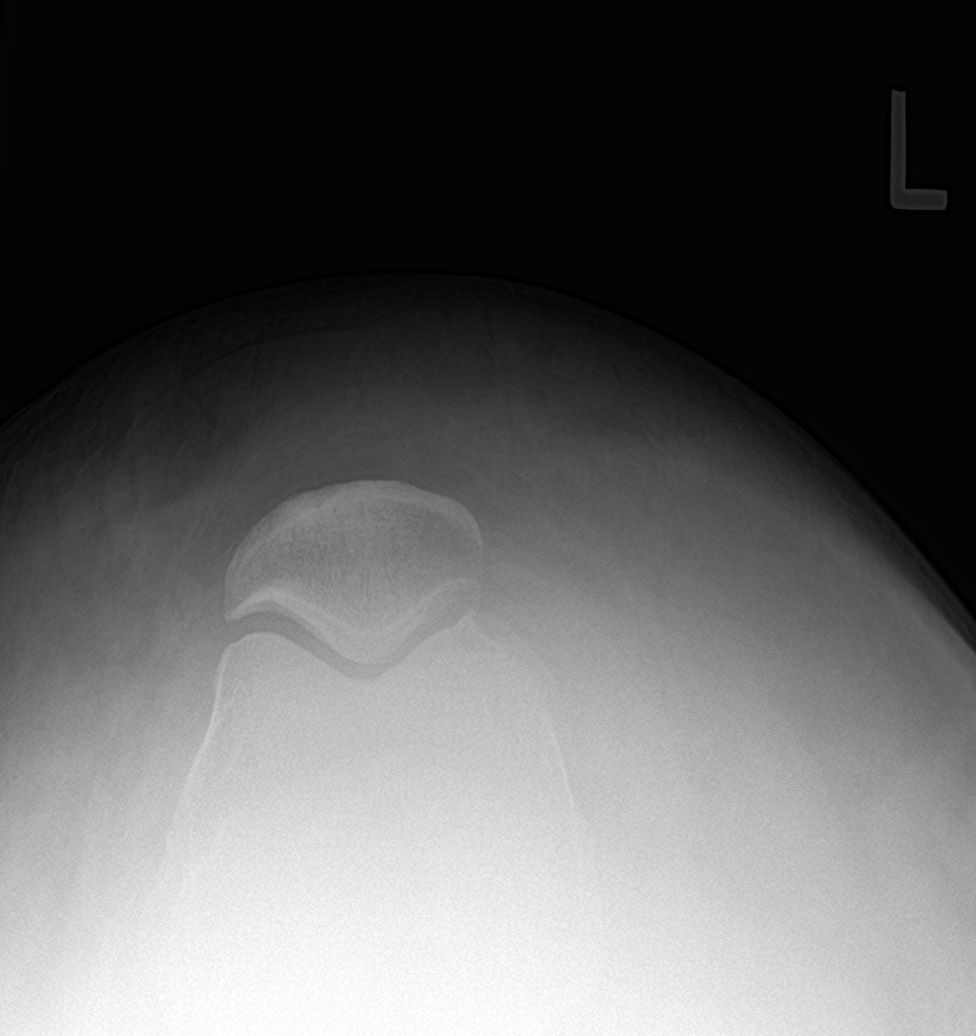

[knee ap bilat standing]
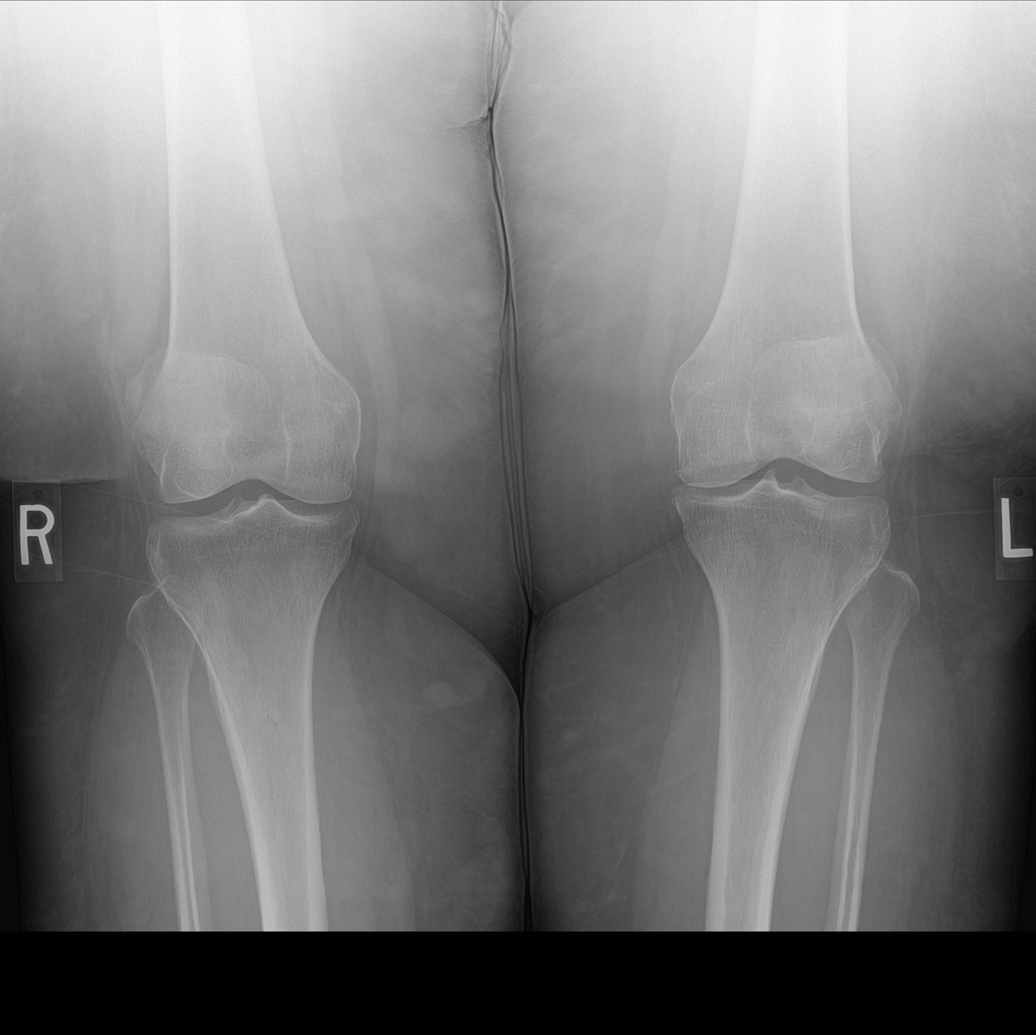

[4 of 4 positions shown; findings below may reference images not displayed]

FINDINGS: Bilateral medial compartment mild degenerative change again noted.
No acute bony abnormality identified. No evidence of fracture
dislocation. Tiny left knee joint effusion cannot be excluded.
IMPRESSION: 1. Bilateral medial compartment mild degenerative change again
noted. No acute bony abnormality.

2.  Tiny left knee joint effusion cannot be excluded.

## 2022-03-24 IMAGING — DX DG KNEE COMPLETE 4+V*R*
4 series · 4 of 4 positions shown · non-contrast
Comparison: None

CLINICAL DATA: BILATERAL knee pain for months, no known injury

EXAM:
RIGHT KNEE - COMPLETE 4+ VIEW

[tunnel]
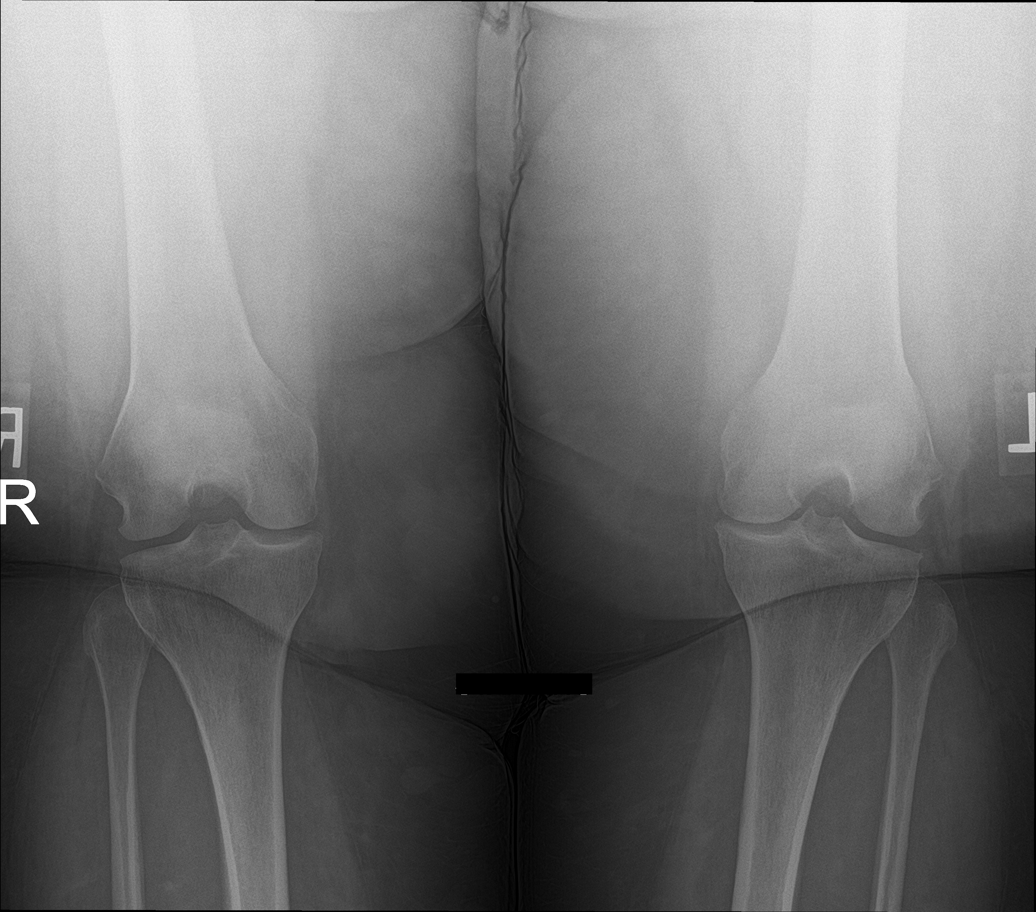

[knee lat]
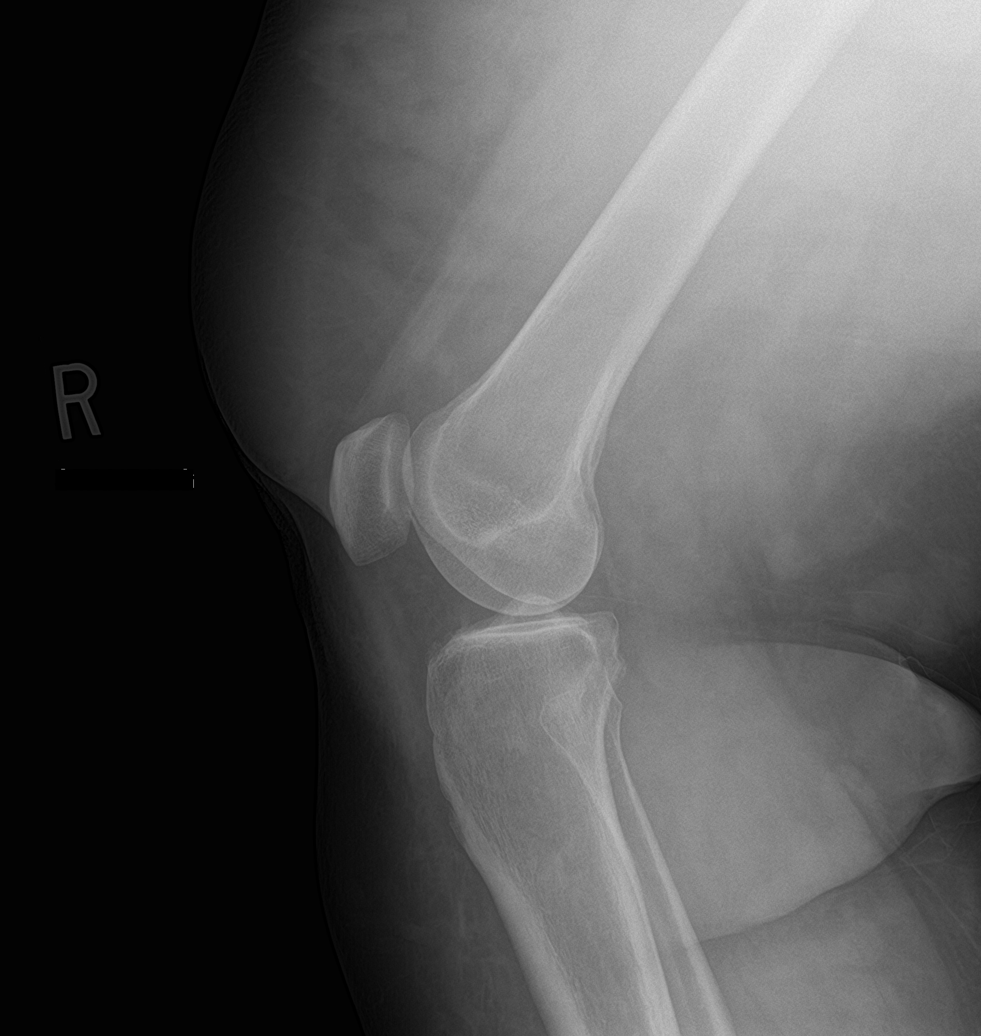

[knee sunrise]
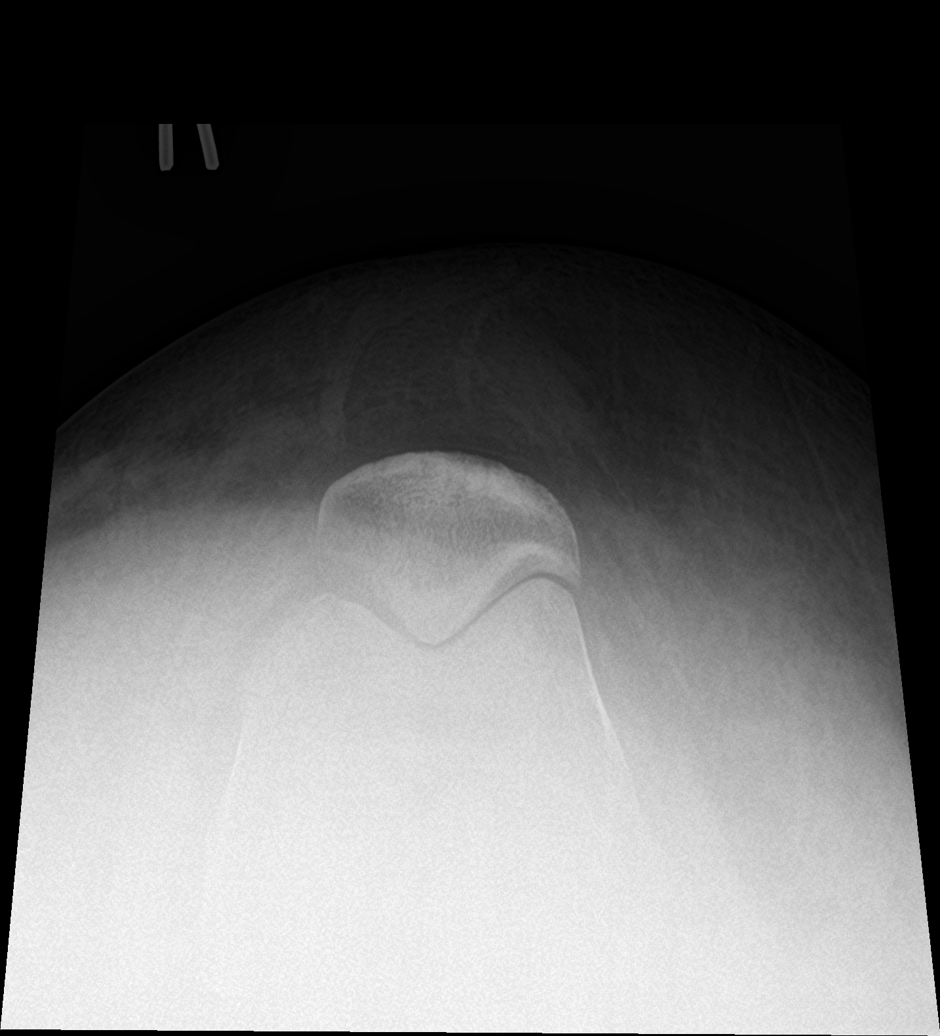

[knee ap bilat standing]
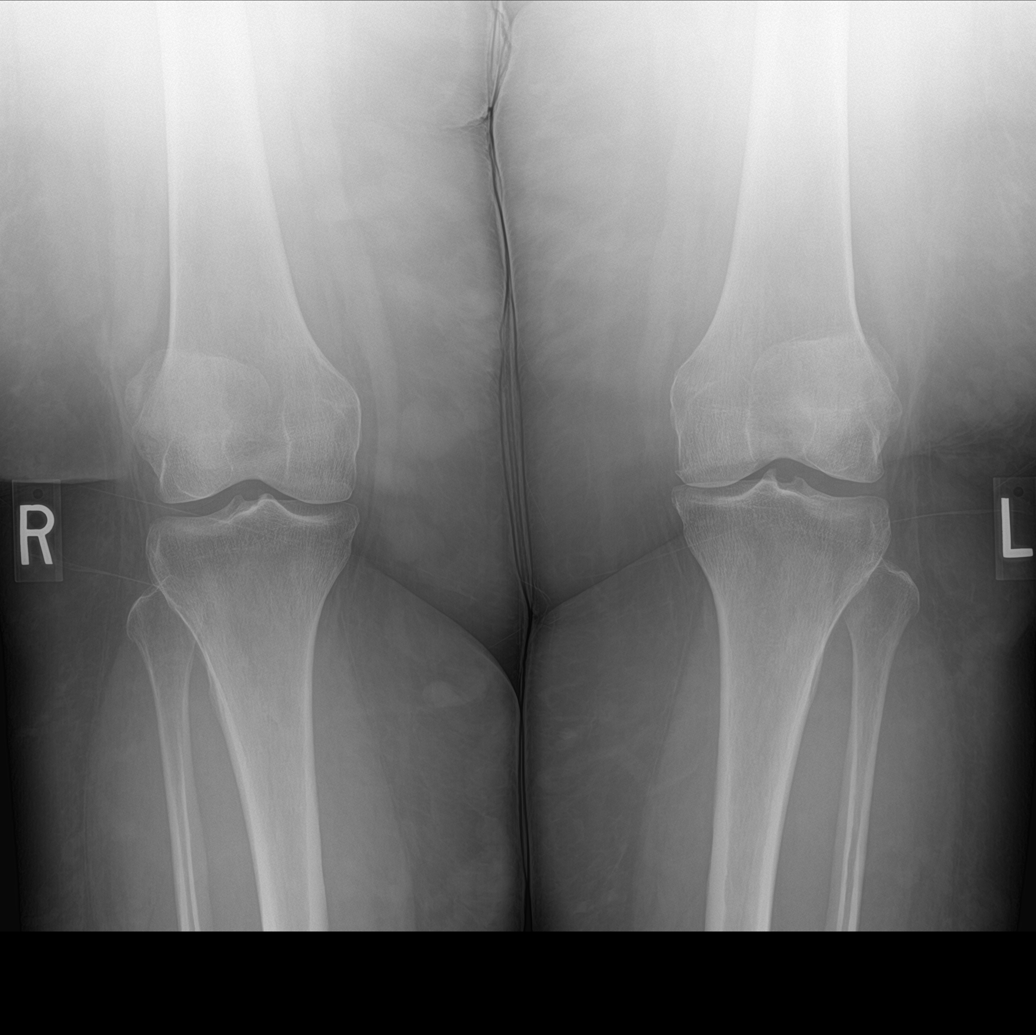

[4 of 4 positions shown; findings below may reference images not displayed]

FINDINGS: Osseous demineralization.

Medial and lateral compartments appear fairly well preserved.

Joint space narrowing and minimal spur formation at patellofemoral
joint.

No acute fracture, dislocation, or bone destruction.

No joint effusion.

AP view of the LEFT knee demonstrates medial compartment joint space
narrowing and spur formation.
IMPRESSION: Mild patellofemoral degenerative changes RIGHT knee.

Degenerative changes at medial compartment LEFT knee.

## 2022-04-09 ENCOUNTER — Other Ambulatory Visit: Payer: Self-pay | Admitting: Family Medicine

## 2022-04-11 NOTE — Telephone Encounter (Signed)
PA

## 2022-04-18 ENCOUNTER — Telehealth: Payer: Self-pay | Admitting: Family Medicine

## 2022-04-18 NOTE — Telephone Encounter (Signed)
Patient called and stated she needs a prior auth on her [employer] she was previously using a coupon card and the card is now expired and per her insurance she now needs a prior auth before it can be refilled   [place]

## 2022-04-25 ENCOUNTER — Telehealth: Payer: Self-pay

## 2022-04-26 NOTE — Telephone Encounter (Addendum)
Initiated Prior authorization [id number] 12.5MG /0.5ML pen-injectors Via: Covermymeds Case/Key: [id number] Status: approved  as of 04/26/22 Reason: The request has been approved. The authorization is effective for a maximum of 12 fills from 04/27/2022 to 04/27/2023, as long as the member is enrolled in their current health plan.  Notified Pt via: Mychart

## 2022-04-27 ENCOUNTER — Other Ambulatory Visit: Payer: Self-pay | Admitting: Family Medicine

## 2022-04-27 DIAGNOSIS — E282 Polycystic ovarian syndrome: Secondary | ICD-10-CM

## 2022-05-05 ENCOUNTER — Other Ambulatory Visit: Payer: Self-pay | Admitting: Family Medicine

## 2022-05-05 DIAGNOSIS — E282 Polycystic ovarian syndrome: Secondary | ICD-10-CM

## 2022-05-07 ENCOUNTER — Other Ambulatory Visit: Payer: Self-pay

## 2022-05-07 DIAGNOSIS — E282 Polycystic ovarian syndrome: Secondary | ICD-10-CM

## 2022-05-07 MED ORDER — NORGESTIM-ETH ESTRAD TRIPHASIC 0.18/0.215/0.25 MG-35 MCG PO TABS: 1.0000 | ORAL_TABLET | Freq: Every day | ORAL | 3 refills | Status: DC

## 2022-05-14 ENCOUNTER — Ambulatory Visit: Payer: Managed Care, Other (non HMO) | Admitting: Sports Medicine

## 2022-05-14 DIAGNOSIS — M170 Bilateral primary osteoarthritis of knee: Secondary | ICD-10-CM

## 2022-05-14 MED ORDER — ACETAMINOPHEN ER 650 MG PO TBCR: 650.0000 mg | EXTENDED_RELEASE_TABLET | Freq: Three times a day (TID) | ORAL | 3 refills | Status: AC | PRN

## 2022-05-14 MED ORDER — CELECOXIB 200 MG PO CAPS: ORAL_CAPSULE | ORAL | 2 refills | Status: DC

## 2022-05-14 NOTE — Progress Notes (Signed)
    Procedures performed today:    None.  Independent interpretation of notes and tests performed by another provider:   None.  Brief History, Exam, Impression, and Recommendations:    Primary osteoarthritis of both knees Pleasant 41 year old female returns, we finished Orthovisc back in June, she continues to have pain. She was doing well with NSAIDs and Tylenol but stopped, I would like her to restart her Tylenol 3 times daily, adding Celebrex as well 1-2 times daily. Return to see me in 6 weeks, steroid injections if not better.  Chronic process with exacerbation and pharmacologic intervention  ____________________________________________ Ihor [place]. Benjamin Stain, M.D., ABFM., CAQSM., AME. Primary Care and Sports Medicine [facility]  Adjunct Professor of Family Medicine  [employer] [facility] [employer] [employer] [employer]  Restaurant manager, fast food

## 2022-05-14 NOTE — Assessment & Plan Note (Signed)
Pleasant 41 year old female returns, we finished Orthovisc back in June, she continues to have pain. She was doing well with NSAIDs and Tylenol but stopped, I would like her to restart her Tylenol 3 times daily, adding Celebrex as well 1-2 times daily. Return to see me in 6 weeks, steroid injections if not better.

## 2022-06-18 ENCOUNTER — Encounter: Payer: Self-pay | Admitting: Family Medicine

## 2022-06-18 ENCOUNTER — Ambulatory Visit: Payer: Managed Care, Other (non HMO) | Admitting: Family Medicine

## 2022-06-18 VITALS — BP 135/83 | HR 72 | Ht 63.0 in | Wt 261.0 lb

## 2022-06-18 DIAGNOSIS — E118 Type 2 diabetes mellitus with unspecified complications: Secondary | ICD-10-CM

## 2022-06-18 DIAGNOSIS — R790 Abnormal level of blood mineral: Secondary | ICD-10-CM | POA: Diagnosis not present

## 2022-06-18 DIAGNOSIS — Z23 Encounter for immunization: Secondary | ICD-10-CM

## 2022-06-18 DIAGNOSIS — I10 Essential (primary) hypertension: Secondary | ICD-10-CM

## 2022-06-18 LAB — POCT GLYCOSYLATED HEMOGLOBIN (HGB A1C): HbA1c POC (<> result, manual entry): 5.2 % (ref 4.0–5.6)

## 2022-06-18 NOTE — Progress Notes (Signed)
Established Patient Office Visit  Subjective   Patient ID: Deborah Carrillo, female    DOB: Oct 18, 1980  Age: 41 y.o. MRN: [id number]  Chief Complaint  Patient presents with   Diabetes   Hypertension    HPI  Hypertension- Pt denies chest pain, SOB, dizziness, or heart palpitations.  Taking meds as directed w/o problems.  Denies medication side effects.    Diabetes - no hypoglycemic events. No wounds or sores that are not healing well. No increased thirst or urination. Checking glucose at home. Taking medications as prescribed without any side effects.  Low ferritin-she is taking an iron supplement as well as calcium.  Still feels really tired.  Sleep is okay she does typically take a Unisom to help her sleep.  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    ROS    Objective:     BP 135/83   Pulse 72   Ht 5\' 3"  (1.6 m)   Wt 261 lb (118.4 kg)   SpO2 100%   BMI 46.23 kg/m    Physical Exam Vitals and nursing note reviewed.  Constitutional:      Appearance: She is well-developed.  HENT:     Head: Normocephalic and atraumatic.  Cardiovascular:     Rate and Rhythm: Normal rate and regular rhythm.     Heart sounds: Normal heart sounds.  Pulmonary:     Effort: Pulmonary effort is normal.     Breath sounds: Normal breath sounds.  Skin:    General: Skin is warm and dry.  Neurological:     Mental Status: She is alert and oriented to person, place, and time.  Psychiatric:        Behavior: Behavior normal.      Results for orders placed or performed in visit on 06/18/22  POCT glycosylated hemoglobin (Hb A1C)  Result Value Ref Range   Hemoglobin A1C     HbA1c POC (<> result, manual entry) 5.2 4.0 - 5.6 %   HbA1c, POC (prediabetic range)     HbA1c, POC (controlled diabetic range)        The 10-year ASCVD risk score (Arnett DK, et al., 2019) is: 1.2%    Assessment & Plan:   Problem List  Items Addressed This Visit       Cardiovascular and Mediastinum   Essential hypertension - Primary (Chronic)     Endocrine   Controlled diabetes mellitus type 2 with complications, unspecified whether long term insulin use (HCC)    A1c looks phenomenal today at 5.2 but is continuing to trend down and she is continuing to lose weight.  She is down another 7 pounds since I last saw her which is great she is continuing to work on [employer].  She did add back in a protein drink for breakfast.  And she has been walking at night 3 days/week.  Just encouraged her to keep up the great work.  We do have room to go up to 15 mg on the [facility] if at some point she would like to do so.      Relevant Orders   POCT glycosylated hemoglobin (Hb A1C) (Completed)     Other   Low ferritin    He has been taking an iron supplement.  Plan to recheck ferritin levels.      Low ferritin-plan to recheck iron stores.  Return in about 4 months (  around 10/19/2022) for Diabetes follow-up, Hypertension.    Nani Gasser, MD

## 2022-06-18 NOTE — Assessment & Plan Note (Signed)
He has been taking an iron supplement.  Plan to recheck ferritin levels.

## 2022-06-18 NOTE — Assessment & Plan Note (Signed)
A1c looks phenomenal today at 5.2 but is continuing to trend down and she is continuing to lose weight.  She is down another 7 pounds since I last saw her which is great she is continuing to work on [employer].  She did add back in a protein drink for breakfast.  And she has been walking at night 3 days/week.  Just encouraged her to keep up the great work.  We do have room to go up to 15 mg on the [facility] if at some point she would like to do so.

## 2022-06-19 ENCOUNTER — Other Ambulatory Visit: Payer: Self-pay | Admitting: *Deleted

## 2022-06-19 DIAGNOSIS — E282 Polycystic ovarian syndrome: Secondary | ICD-10-CM

## 2022-06-19 LAB — IRON,TIBC AND FERRITIN PANEL
%SAT: 23 % (calc) (ref 16–45)
Ferritin: 21 ng/mL (ref 16–232)
Iron: 105 ug/dL (ref 40–190)
TIBC: 460 mcg/dL (calc) — ABNORMAL HIGH (ref 250–450)

## 2022-06-19 MED ORDER — NORGESTIM-ETH ESTRAD TRIPHASIC 0.18/0.215/0.25 MG-35 MCG PO TABS: 1.0000 | ORAL_TABLET | Freq: Every day | ORAL | 3 refills | Status: DC

## 2022-06-19 NOTE — Progress Notes (Signed)
HI Rai, Your iron is coming up.  Ferritin was 11 now up to 21.  Please continue your iron supplement and we can recheck again in 3-4 mo.

## 2022-06-25 ENCOUNTER — Ambulatory Visit: Payer: Managed Care, Other (non HMO) | Admitting: Sports Medicine

## 2022-06-26 ENCOUNTER — Ambulatory Visit: Payer: Managed Care, Other (non HMO) | Admitting: Sports Medicine

## 2022-07-13 ENCOUNTER — Other Ambulatory Visit: Payer: Self-pay

## 2022-07-13 ENCOUNTER — Encounter: Payer: Self-pay | Admitting: Family Medicine

## 2022-07-13 DIAGNOSIS — E282 Polycystic ovarian syndrome: Secondary | ICD-10-CM

## 2022-07-13 MED ORDER — NORGESTIM-ETH ESTRAD TRIPHASIC 0.18/0.215/0.25 MG-35 MCG PO TABS: 1.0000 | ORAL_TABLET | Freq: Every day | ORAL | 0 refills | Status: DC

## 2022-07-25 LAB — HM DIABETES EYE EXAM

## 2022-09-21 ENCOUNTER — Encounter: Payer: Self-pay | Admitting: Family Medicine

## 2022-09-21 ENCOUNTER — Other Ambulatory Visit: Payer: Self-pay | Admitting: *Deleted

## 2022-09-21 MED ORDER — MOUNJARO 15 MG/0.5ML ~~LOC~~ SOAJ: 15.0000 mg | SUBCUTANEOUS | 1 refills | Status: DC

## 2022-09-21 NOTE — Telephone Encounter (Signed)
Okay. Task completed.

## 2022-10-04 ENCOUNTER — Other Ambulatory Visit: Payer: Self-pay | Admitting: Family Medicine

## 2022-10-04 ENCOUNTER — Other Ambulatory Visit: Payer: Self-pay | Admitting: *Deleted

## 2022-10-04 DIAGNOSIS — E282 Polycystic ovarian syndrome: Secondary | ICD-10-CM

## 2022-10-04 MED ORDER — NORGESTIM-ETH ESTRAD TRIPHASIC 0.18/0.215/0.25 MG-35 MCG PO TABS: 1.0000 | ORAL_TABLET | Freq: Every day | ORAL | 0 refills | Status: DC

## 2022-10-11 ENCOUNTER — Encounter: Payer: Self-pay | Admitting: Family Medicine

## 2022-10-22 ENCOUNTER — Ambulatory Visit: Payer: Managed Care, Other (non HMO) | Admitting: Family Medicine

## 2022-10-22 ENCOUNTER — Encounter: Payer: Self-pay | Admitting: Family Medicine

## 2022-10-22 VITALS — BP 144/92 | HR 79 | Ht 63.0 in | Wt 245.0 lb

## 2022-10-22 DIAGNOSIS — N920 Excessive and frequent menstruation with regular cycle: Secondary | ICD-10-CM

## 2022-10-22 DIAGNOSIS — E118 Type 2 diabetes mellitus with unspecified complications: Secondary | ICD-10-CM

## 2022-10-22 DIAGNOSIS — I10 Essential (primary) hypertension: Secondary | ICD-10-CM | POA: Diagnosis not present

## 2022-10-22 DIAGNOSIS — E282 Polycystic ovarian syndrome: Secondary | ICD-10-CM

## 2022-10-22 LAB — POCT GLYCOSYLATED HEMOGLOBIN (HGB A1C): Hemoglobin A1C: 5.9 % — AB (ref 4.0–5.6)

## 2022-10-22 MED ORDER — NORGESTIM-ETH ESTRAD TRIPHASIC 0.18/0.215/0.25 MG-35 MCG PO TABS: 1.0000 | ORAL_TABLET | Freq: Every day | ORAL | 3 refills | Status: DC

## 2022-10-22 MED ORDER — METOPROLOL SUCCINATE ER 100 MG PO TB24: 100.0000 mg | ORAL_TABLET | Freq: Every day | ORAL | 3 refills | Status: DC

## 2022-10-22 NOTE — Assessment & Plan Note (Signed)
Blood pressure up a little bit today.  Will recheck before she leaves.  If still elevated will to follow-up in about 2 weeks for nurse visit.

## 2022-10-22 NOTE — Progress Notes (Signed)
Established Patient Office Visit  Subjective   Patient ID: Deborah Carrillo, female    DOB: Jun 29, 1981  Age: 42 y.o. MRN: [id number]  Chief Complaint  Patient presents with   Hypertension   Diabetes    HPI  Diabetes - no hypoglycemic events. No wounds or sores that are not healing well. No increased thirst or urination. Checking glucose at home. Taking medications as prescribed without any side effects.  Hypertension- Pt denies chest pain, SOB, dizziness, or heart palpitations.  Taking meds as directed w/o problems.  Denies medication side effects.    She also reports for the last 2 weeks she has been experiencing some night sweats she has not had any colds or anything recently.  She also has had a little bit of menstrual spotting which is a little unusual she is on birth control and has been taking continuous birth control.  She is also had a little bit more cramping than usual.  As if she could be starting to experience some perimenopausal symptoms.  She reports that her Pap smear is up-to-date she is scheduled in March 2024.    ROS    Objective:     BP (!) 144/92   Pulse 79   Ht 5\' 3"  (1.6 m)   Wt 245 lb (111.1 kg)   SpO2 100%   BMI 43.40 kg/m    Physical Exam Constitutional:      Appearance: She is well-developed.  HENT:     Head: Normocephalic and atraumatic.     Right Ear: External ear normal.     Left Ear: External ear normal.     Nose: Nose normal.  Eyes:     Conjunctiva/sclera: Conjunctivae normal.     Pupils: Pupils are equal, round, and reactive to light.  Neck:     Thyroid: No thyromegaly.  Cardiovascular:     Rate and Rhythm: Normal rate and regular rhythm.     Heart sounds: Normal heart sounds.  Pulmonary:     Effort: Pulmonary effort is normal.     Breath sounds: Normal breath sounds. No wheezing.  Musculoskeletal:     Cervical back: Neck supple.  Lymphadenopathy:     Cervical: No cervical adenopathy.  Skin:    General: Skin is warm and dry.   Neurological:     Mental Status: She is alert and oriented to person, place, and time.      Results for orders placed or performed in visit on 10/22/22  POCT glycosylated hemoglobin (Hb A1C)  Result Value Ref Range   Hemoglobin A1C 5.9 (A) 4.0 - 5.6 %   HbA1c POC (<> result, manual entry)     HbA1c, POC (prediabetic range)     HbA1c, POC (controlled diabetic range)        The 10-year ASCVD risk score (Arnett DK, et al., 2019) is: 1.4%    Assessment & Plan:   Problem List Items Addressed This Visit       Cardiovascular and Mediastinum   Essential hypertension (Chronic)    Blood pressure up a little bit today.  Will recheck before she leaves.  If still elevated will to follow-up in about 2 weeks for nurse visit.      Relevant Medications   metoprolol succinate (TOPROL-XL) 100 MG 24 hr tablet     Endocrine   PCOS (polycystic ovarian syndrome)    Currently on birth control but doing continuous treatment.  It is possible have some breakthrough bleeding because of that but if it  continues then we discussed possibly getting further workup with a pelvic ultrasound to take a look at the uterus and how thick or thin the lining is.      Relevant Medications   Norgestimate-Ethinyl Estradiol Triphasic (TRI-ESTARYLLA) 0.18/0.215/0.25 MG-35 MCG tablet   Controlled diabetes mellitus type 2 with complications, unspecified whether long term insulin use (HCC) - Primary    A1c is still looks great today at 5.9.  Continue Mounjaro.  She is pretty excited that she has now lost over 100 pounds since she started her weight loss journey about 8 years ago.  She would really like to get to close to 200 that is her goal ultimately.  Continue to work on [employer] and regular exercise.      Relevant Orders   POCT glycosylated hemoglobin (Hb A1C) (Completed)   Other Visit Diagnoses     Menstrual spotting           Return in about 4 months (around 02/20/2023) for Diabetes follow-up.     Beatrice Lecher, MD

## 2022-10-22 NOTE — Assessment & Plan Note (Signed)
Currently on birth control but doing continuous treatment.  It is possible have some breakthrough bleeding because of that but if it continues then we discussed possibly getting further workup with a pelvic ultrasound to take a look at the uterus and how thick or thin the lining is.

## 2022-10-22 NOTE — Assessment & Plan Note (Signed)
A1c is still looks great today at 5.9.  Continue Mounjaro.  She is pretty excited that she has now lost over 100 pounds since she started her weight loss journey about 8 years ago.  She would really like to get to close to 200 that is her goal ultimately.  Continue to work on [employer] and regular exercise.

## 2022-11-08 ENCOUNTER — Other Ambulatory Visit: Payer: Self-pay

## 2022-11-08 ENCOUNTER — Encounter: Payer: Self-pay | Admitting: Family Medicine

## 2022-11-08 DIAGNOSIS — I10 Essential (primary) hypertension: Secondary | ICD-10-CM

## 2022-11-08 MED ORDER — AMLODIPINE BESYLATE 10 MG PO TABS: 10.0000 mg | ORAL_TABLET | Freq: Every day | ORAL | 3 refills | Status: DC

## 2022-11-08 MED ORDER — METOPROLOL SUCCINATE ER 100 MG PO TB24: 100.0000 mg | ORAL_TABLET | Freq: Every day | ORAL | 3 refills | Status: DC

## 2022-11-14 ENCOUNTER — Telehealth: Payer: Managed Care, Other (non HMO) | Admitting: Family Medicine

## 2022-11-14 DIAGNOSIS — J069 Acute upper respiratory infection, unspecified: Secondary | ICD-10-CM | POA: Diagnosis not present

## 2022-11-14 MED ORDER — SALINE NASAL SPRAY 0.65 % NA SOLN: 2.0000 | NASAL | 0 refills | Status: DC | PRN

## 2022-11-14 MED ORDER — FLUTICASONE PROPIONATE 50 MCG/ACT NA SUSP: 2.0000 | Freq: Every day | NASAL | 0 refills | Status: DC

## 2022-11-14 MED ORDER — PSEUDOEPH-BROMPHEN-DM 30-2-10 MG/5ML PO SYRP: 5.0000 mL | ORAL_SOLUTION | Freq: Four times a day (QID) | ORAL | 0 refills | Status: DC | PRN

## 2022-11-14 NOTE — Progress Notes (Signed)
Virtual Visit Consent   Deborah Carrillo, you are scheduled for a virtual visit with a [facility] provider today. Just as with appointments in the office, your consent must be obtained to participate. Your consent will be active for this visit and any virtual visit you may have with one of our providers in the next 365 days. If you have a MyChart account, a copy of this consent can be sent to you electronically.  As this is a virtual visit, video technology does not allow for your provider to perform a traditional examination. This may limit your provider's ability to fully assess your condition. If your provider identifies any concerns that need to be evaluated in person or the need to arrange testing (such as labs, EKG, etc.), we will make arrangements to do so. Although advances in technology are sophisticated, we cannot ensure that it will always work on either your end or our end. If the connection with a video visit is poor, the visit may have to be switched to a telephone visit. With either a video or telephone visit, we are not always able to ensure that we have a secure connection.  By engaging in this virtual visit, you consent to the provision of healthcare and authorize for your insurance to be billed (if applicable) for the services provided during this visit. Depending on your insurance coverage, you may receive a charge related to this service.  I need to obtain your verbal consent now. Are you willing to proceed with your visit today? Jequetta Aberg has provided verbal consent on 11/14/2022 for a virtual visit (video or telephone). Perlie Mayo, NP  Date: 11/14/2022 10:31 AM  Virtual Visit via Video Note   I, Perlie Mayo, connected with  Nickola Willauer  ([id number], 08/18/1981) on 11/14/22 at 10:30 AM EST by a video-enabled telemedicine application and verified that I am speaking with the correct person using two identifiers.  Location: Patient: Virtual Visit Location Patient:  Home Provider: Virtual Visit Location Provider: Home Office   I discussed the limitations of evaluation and management by telemedicine and the availability of in person appointments. The patient expressed understanding and agreed to proceed.    History of Present Illness: Deborah Carrillo is a 42 y.o. who identifies as a female who was assigned female at birth, and is being seen today for sinus pressure and burning. Onset was yesterday Associated symptoms are sinus pressure, burning- sensation behind her nose. Modifying factors are day quil and sudafed Denies chest pain, shortness of breath, fevers, chills, no congestion Exposure to sick contacts- unknown COVID test: no   Problems:  Patient Active Problem List   Diagnosis Date Noted   Low ferritin 06/18/2022   Varicose veins of left lower extremity 03/12/2022   Stress and adjustment reaction 06/05/2021   Right ankle pain 02/15/2021   Phlebitis 09/15/2020   Acute depression 06/15/2019   Primary osteoarthritis of both knees 06/21/2017   Hyperlipidemia 12/28/2016   BMI 50.0-59.9, adult ([facility]) 02/11/2015   Essential hypertension 09/24/2012   PCOS (polycystic ovarian syndrome) 11/23/2011   Controlled diabetes mellitus type 2 with complications, unspecified whether long term insulin use ([place]) 11/23/2011   FEMALE INFERTILITY 09/15/2010    Allergies:  Allergies  Allergen Reactions   Lisinopril     Angioedema   Metformin And Related Other (See Comments)    GI upset   Victoza [Liraglutide] Other (See Comments)    Soreness and bruising   Amoxicillin    Erythromycin  Penicillins    Medications:  Current Outpatient Medications:    acetaminophen (TYLENOL) 650 MG CR tablet, Take 1 tablet (650 mg total) by mouth every 8 (eight) hours as needed for pain., Disp: 90 tablet, Rfl: 3   amLODipine (NORVASC) 10 MG tablet, Take 1 tablet (10 mg total) by mouth daily., Disp: 90 tablet, Rfl: 3   metoprolol succinate (TOPROL-XL) 100 MG 24 hr  tablet, Take 1 tablet (100 mg total) by mouth daily., Disp: 90 tablet, Rfl: 3   Norgestimate-Ethinyl Estradiol Triphasic (TRI-ESTARYLLA) 0.18/0.215/0.25 MG-35 MCG tablet, Take 1 tablet by mouth daily., Disp: 84 tablet, Rfl: 3   tirzepatide (MOUNJARO) 15 MG/0.5ML Pen, Inject 15 mg into the skin once a week., Disp: 6 mL, Rfl: 1  Observations/Objective: Patient is well-developed, well-nourished in no acute distress.  Resting comfortably  at home.  Head is normocephalic, atraumatic.  No labored breathing.  Speech is clear and coherent with logical content.  Patient is alert and oriented at baseline.    Assessment and Plan:  1. Viral URI  - fluticasone (FLONASE) 50 MCG/ACT nasal spray; Place 2 sprays into both nostrils daily.  Dispense: 16 g; Refill: 0 - sodium chloride (OCEAN) 0.65 % nasal spray; Place 2 sprays into the nose as needed for congestion.  Dispense: 60 mL; Refill: 0 - brompheniramine-pseudoephedrine-DM 30-2-10 MG/5ML syrup; Take 5 mLs by mouth 4 (four) times daily as needed.  Dispense: 120 mL; Refill: 0  -Take meds as prescribed -Rest -Use a cool mist humidifier especially during the winter months when heat dries out the air. - Use saline nose sprays frequently to help soothe nasal passages and promote drainage. -Saline irrigations of the nose can be very helpful if done frequently.             * 4X daily for 1 week*             * Use of a nettie pot can be helpful with this.  *Follow directions with this* *Boiled or distilled water only -stay hydrated by drinking plenty of fluids - Keep thermostat turn down low to prevent drying out sinuses   If you do not improve you will need a follow up visit in person.                Reviewed side effects, risks and benefits of medication.    Patient acknowledged agreement and understanding of the plan.   Past Medical, Surgical, Social History, Allergies, and Medications have been Reviewed.    Follow Up Instructions: I  discussed the assessment and treatment plan with the patient. The patient was provided an opportunity to ask questions and all were answered. The patient agreed with the plan and demonstrated an understanding of the instructions.  A copy of instructions were sent to the patient via MyChart unless otherwise noted below.    The patient was advised to call back or seek an in-person evaluation if the symptoms worsen or if the condition fails to improve as anticipated.  Time:  I spent 10 minutes with the patient via telehealth technology discussing the above problems/concerns.    Perlie Mayo, NP

## 2022-11-14 NOTE — Patient Instructions (Addendum)
Deborah Carrillo, thank you for joining Deborah Mayo, Deborah Carrillo for today's virtual visit.  While this provider is not your primary care provider (PCP), if your PCP is located in our provider database this encounter information will be shared with them immediately following your visit.   [facility] account gives you access to today's visit and all your visits, tests, and labs performed at [facility] " click here if you don't have a [facility] account or go to [email or web address]  Consent: (Patient) Deborah Carrillo provided verbal consent for this virtual visit at the beginning of the encounter.  Current Medications:  Current Outpatient Medications:    brompheniramine-pseudoephedrine-DM 30-2-10 MG/5ML syrup, Take 5 mLs by mouth 4 (four) times daily as needed., Disp: 120 mL, Rfl: 0   fluticasone (FLONASE) 50 MCG/ACT nasal spray, Place 2 sprays into both nostrils daily., Disp: 16 g, Rfl: 0   sodium chloride (OCEAN) 0.65 % nasal spray, Place 2 sprays into the nose as needed for congestion., Disp: 60 mL, Rfl: 0   acetaminophen (TYLENOL) 650 MG CR tablet, Take 1 tablet (650 mg total) by mouth every 8 (eight) hours as needed for pain., Disp: 90 tablet, Rfl: 3   amLODipine (NORVASC) 10 MG tablet, Take 1 tablet (10 mg total) by mouth daily., Disp: 90 tablet, Rfl: 3   metoprolol succinate (TOPROL-XL) 100 MG 24 hr tablet, Take 1 tablet (100 mg total) by mouth daily., Disp: 90 tablet, Rfl: 3   Norgestimate-Ethinyl Estradiol Triphasic (TRI-ESTARYLLA) 0.18/0.215/0.25 MG-35 MCG tablet, Take 1 tablet by mouth daily., Disp: 84 tablet, Rfl: 3   tirzepatide (MOUNJARO) 15 MG/0.5ML Pen, Inject 15 mg into the skin once a week., Disp: 6 mL, Rfl: 1   Medications ordered in this encounter:  Meds ordered this encounter  Medications   fluticasone (FLONASE) 50 MCG/ACT nasal spray    Sig: Place 2 sprays into both nostrils daily.    Dispense:  16 g    Refill:  0    Order Specific  Question:   Supervising Provider    Answer:   Chase Picket [id number]   sodium chloride (OCEAN) 0.65 % nasal spray    Sig: Place 2 sprays into the nose as needed for congestion.    Dispense:  60 mL    Refill:  0    Order Specific Question:   Supervising Provider    Answer:   Chase Picket [id number]   brompheniramine-pseudoephedrine-DM 30-2-10 MG/5ML syrup    Sig: Take 5 mLs by mouth 4 (four) times daily as needed.    Dispense:  120 mL    Refill:  0    Order Specific Question:   Supervising Provider    Answer:   Chase Picket [id number]     *If you need refills on other medications prior to your next appointment, please contact your pharmacy*  Follow-Up: Call back or seek an in-person evaluation if the symptoms worsen or if the condition fails to improve as anticipated.  [facility] [phone]  Other Instructions  -Take meds as prescribed -Rest -Use a cool mist humidifier especially during the winter months when heat dries out the air. - Use saline nose sprays frequently to help soothe nasal passages and promote drainage. -Saline irrigations of the nose can be very helpful if done frequently.             * 4X daily for 1 week*             *  Use of a nettie pot can be helpful with this.  *Follow directions with this* *Boiled or distilled water only -stay hydrated by drinking plenty of fluids - Keep thermostat turn down low to prevent drying out sinuses   If you do not improve you will need a follow up visit in person.                  If you have been instructed to have an in-person evaluation today at a local Urgent Care facility, please use the link below. It will take you to a list of all of our available [facility], including address, phone number and hours of operation. Please do not delay care.  [facility]  If you or a family member do not have a primary care provider, use the link below to schedule a visit and  establish care. When you choose a [facility] primary care physician or advanced practice provider, you gain a long-term partner in health. Find a Primary Care Provider  Learn more about [facility]'s in-office and virtual care options: [facility]

## 2022-12-20 ENCOUNTER — Other Ambulatory Visit: Payer: Self-pay | Admitting: Family Medicine

## 2022-12-20 DIAGNOSIS — E282 Polycystic ovarian syndrome: Secondary | ICD-10-CM

## 2023-01-31 ENCOUNTER — Telehealth: Payer: Managed Care, Other (non HMO) | Admitting: Physician Assistant

## 2023-01-31 DIAGNOSIS — R3989 Other symptoms and signs involving the genitourinary system: Secondary | ICD-10-CM

## 2023-01-31 MED ORDER — NITROFURANTOIN MONOHYD MACRO 100 MG PO CAPS
100.0000 mg | ORAL_CAPSULE | Freq: Two times a day (BID) | ORAL | 0 refills | Status: DC
Start: 2023-01-31 — End: 2023-04-09

## 2023-01-31 NOTE — Progress Notes (Signed)
E-Visit for Urinary Problems  We are sorry that you are not feeling well.  Here is how we plan to help!  Based on what you shared with me it looks like you most likely have a simple urinary tract infection.  A UTI (Urinary Tract Infection) is a bacterial infection of the bladder.  Most cases of urinary tract infections are simple to treat but a key part of your care is to encourage you to drink plenty of fluids and watch your symptoms carefully.  I have prescribed MacroBid 100 mg twice a day for 5 days.  Your symptoms should gradually improve. Call us if the burning in your urine worsens, you develop worsening fever, back pain or pelvic pain or if your symptoms do not resolve after completing the antibiotic.  Urinary tract infections can be prevented by drinking plenty of water to keep your body hydrated.  Also be sure when you wipe, wipe from front to back and don't hold it in!  If possible, empty your bladder every 4 hours.  HOME CARE Drink plenty of fluids Compete the full course of the antibiotics even if the symptoms resolve Remember, when you need to go.go. Holding in your urine can increase the likelihood of getting a UTI! GET HELP RIGHT AWAY IF: You cannot urinate You get a high fever Worsening back pain occurs You see blood in your urine You feel sick to your stomach or throw up You feel like you are going to pass out  MAKE SURE YOU  Understand these instructions. Will watch your condition. Will get help right away if you are not doing well or get worse.   Thank you for choosing an e-visit.  Your e-visit answers were reviewed by a board certified advanced clinical practitioner to complete your personal care plan. Depending upon the condition, your plan could have included both over the counter or prescription medications.  Please review your pharmacy choice. Make sure the pharmacy is open so you can pick up prescription now. If there is a problem, you may contact your  provider through MyChart messaging and have the prescription routed to another pharmacy.  Your safety is important to us. If you have drug allergies check your prescription carefully.   For the next 24 hours you can use MyChart to ask questions about today's visit, request a non-urgent call back, or ask for a work or school excuse. You will get an email in the next two days asking about your experience. I hope that your e-visit has been valuable and will speed your recovery.  I have spent 5 minutes in review of e-visit questionnaire, review and updating patient chart, medical decision making and response to patient.   Velia Pamer M Theda Payer, PA-C  

## 2023-03-05 ENCOUNTER — Ambulatory Visit: Payer: Managed Care, Other (non HMO) | Admitting: Family Medicine

## 2023-03-07 ENCOUNTER — Encounter: Payer: Self-pay | Admitting: Family Medicine

## 2023-03-08 MED ORDER — MOUNJARO 12.5 MG/0.5ML ~~LOC~~ SOAJ: 12.5000 mg | SUBCUTANEOUS | 0 refills | Status: DC

## 2023-03-08 NOTE — Telephone Encounter (Signed)
Spoke with patient.  She states she was on the 15mg  but  she has been out of this for 3 1/2 weeks now.  No pharmacies have this in stock. She would like to go back down to 12.5 because the  [facility] employee pharmacy in [place] has this in stock and will mail to her.  She states that they told her the 12.5mg  would still be covered by her insurance.  Added  this pharmacy to patient chart.  Marland Kitchen

## 2023-03-11 ENCOUNTER — Other Ambulatory Visit: Payer: Self-pay

## 2023-03-11 MED ORDER — MOUNJARO 12.5 MG/0.5ML ~~LOC~~ SOAJ: 12.5000 mg | SUBCUTANEOUS | 0 refills | Status: DC

## 2023-03-15 ENCOUNTER — Other Ambulatory Visit: Payer: Self-pay

## 2023-03-15 MED ORDER — MOUNJARO 12.5 MG/0.5ML ~~LOC~~ SOAJ: 12.5000 mg | SUBCUTANEOUS | 1 refills | Status: DC

## 2023-03-15 NOTE — Telephone Encounter (Signed)
Resent under different encounter.

## 2023-03-15 NOTE — Telephone Encounter (Signed)
Pended prescription . Was sent to local pharmacy . Patient had requested be sent to [facility] ( states they will mail to her)   Good morning! Can you please send a prescription for Mounjaro 12.5 for a 3 month supply to: [facility] [address] [place]    *not the specialty pharmacy *   Thank you!!!

## 2023-03-20 NOTE — Telephone Encounter (Signed)
Message sent to patient inquiring if she was successful getting the [facility] from the [facility] pharmacy.

## 2023-04-09 ENCOUNTER — Ambulatory Visit: Payer: Managed Care, Other (non HMO) | Admitting: Family Medicine

## 2023-04-09 ENCOUNTER — Encounter: Payer: Self-pay | Admitting: Family Medicine

## 2023-04-09 VITALS — BP 124/78 | HR 69 | Resp 16 | Ht 63.0 in | Wt 265.1 lb

## 2023-04-09 DIAGNOSIS — R1906 Epigastric swelling, mass or lump: Secondary | ICD-10-CM

## 2023-04-09 DIAGNOSIS — I10 Essential (primary) hypertension: Secondary | ICD-10-CM

## 2023-04-09 DIAGNOSIS — E282 Polycystic ovarian syndrome: Secondary | ICD-10-CM | POA: Diagnosis not present

## 2023-04-09 MED ORDER — NORGESTIM-ETH ESTRAD TRIPHASIC 0.18/0.215/0.25 MG-35 MCG PO TABS: 1.0000 | ORAL_TABLET | Freq: Every day | ORAL | 3 refills | Status: DC

## 2023-04-09 MED ORDER — METOPROLOL SUCCINATE ER 100 MG PO TB24: 100.0000 mg | ORAL_TABLET | Freq: Every day | ORAL | 3 refills | Status: DC

## 2023-04-09 MED ORDER — AMLODIPINE BESYLATE 5 MG PO TABS: 5.0000 mg | ORAL_TABLET | Freq: Every day | ORAL | 3 refills | Status: DC

## 2023-04-09 NOTE — Assessment & Plan Note (Signed)
Blood pressure looks absolutely fantastic today.  Recommend to actually decrease the amlodipine and see how she does over the next month we will decrease to 5 mg.

## 2023-04-09 NOTE — Assessment & Plan Note (Signed)
Is a new prescription for birth control sent to the employee pharmacy at [place].  She had filled the last 1 at a local pharmacy and even that was the same medication they switched generics and she has had some spotting since then.

## 2023-04-09 NOTE — Progress Notes (Signed)
Acute Office Visit  Subjective:     Patient ID: Deborah Carrillo, female    DOB: Dec 12, 1980, 42 y.o.   MRN: [id number]  Chief Complaint  Patient presents with   knot or bulge in stomach    HPI Patient is in today for knot or bulge in the epigastric area she said maybe a couple weeks ago.  She was lying in the tanning bed and tried to sit up and noticed the bulge.  She has not had any pain with it.  She says it is a little tender to touch it still feels a little hard and firm above the umbilicus.  Bowels are moving normally in fact she had 2 bowel movements today.  No nausea vomiting or diarrhea.  Neg home pregnancy test  She has been doing piliates and still working on her eating.    ROS      Objective:    BP 124/78 (BP Location: Left Arm, Patient Position: Sitting, Cuff Size: Large)   Pulse 69   Resp 16   Ht 5\' 3"  (1.6 m)   Wt 265 lb 1.3 oz (120.2 kg)   SpO2 97%   BMI 46.96 kg/m    Physical Exam Vitals reviewed.  Constitutional:      Appearance: She is well-developed.  HENT:     Head: Normocephalic and atraumatic.  Eyes:     Conjunctiva/sclera: Conjunctivae normal.  Cardiovascular:     Rate and Rhythm: Normal rate.  Pulmonary:     Effort: Pulmonary effort is normal.  Abdominal:     General: Bowel sounds are normal.     Palpations: Abdomen is soft.     Tenderness: There is abdominal tenderness.     Comments: When she is standing there is definitely a call on vertically of very hard firm tissue just above the epigastric area little bit tender just going to the left just slightly.  It softens when she lays flat on her back.  Skin:    General: Skin is dry.     Coloration: Skin is not pale.  Neurological:     Mental Status: She is alert and oriented to person, place, and time.  Psychiatric:        Behavior: Behavior normal.     No results found for any visits on 04/09/23.      Assessment & Plan:   Problem List Items Addressed This Visit        Cardiovascular and Mediastinum   Essential hypertension (Chronic)    Blood pressure looks absolutely fantastic today.  Recommend to actually decrease the amlodipine and see how she does over the next month we will decrease to 5 mg.      Relevant Medications   amLODipine (NORVASC) 5 MG tablet   metoprolol succinate (TOPROL-XL) 100 MG 24 hr tablet     Endocrine   PCOS (polycystic ovarian syndrome)    Is a new prescription for birth control sent to the employee pharmacy at [place].  She had filled the last 1 at a local pharmacy and even that was the same medication they switched generics and she has had some spotting since then.      Relevant Medications   Norgestimate-Ethinyl Estradiol Triphasic (TRI-ESTARYLLA) 0.18/0.215/0.25 MG-35 MCG tablet   Other Visit Diagnoses     Epigastric mass    -  Primary   Relevant Orders   CT ABDOMEN PELVIS WO CONTRAST       Epigastric mass-most consistent with hernia.  Will get a  CT for further evaluation.  Meds ordered this encounter  Medications   Norgestimate-Ethinyl Estradiol Triphasic (TRI-ESTARYLLA) 0.18/0.215/0.25 MG-35 MCG tablet    Sig: Take 1 tablet by mouth daily.    Dispense:  84 tablet    Refill:  3   amLODipine (NORVASC) 5 MG tablet    Sig: Take 1 tablet (5 mg total) by mouth daily.    Dispense:  90 tablet    Refill:  3   metoprolol succinate (TOPROL-XL) 100 MG 24 hr tablet    Sig: Take 1 tablet (100 mg total) by mouth daily.    Dispense:  90 tablet    Refill:  3    No follow-ups on file.  Nani Gasser, MD

## 2023-04-29 ENCOUNTER — Telehealth: Payer: Self-pay | Admitting: Family Medicine

## 2023-04-29 DIAGNOSIS — R1906 Epigastric swelling, mass or lump: Secondary | ICD-10-CM

## 2023-04-29 NOTE — Telephone Encounter (Signed)
Patient called in wanting the results of her CT scan she did last Monday at [facility]. States that it resulted in her mychart last Tuesday 8/6 and hasn't heard anything from our office. Please advise. Can call patient on her cell phone [phone]

## 2023-04-30 NOTE — Telephone Encounter (Signed)
Deborah Carrillo agreed to the referral to general surgery.   I called [facility] and they will call radiology to check the report about pulmonary nodule.   [facility]  [address]  [place], [place] [address]  [phone]

## 2023-04-30 NOTE — Telephone Encounter (Signed)
They did not see a mass or anything worrisome.  They did not definitively diagnose a hernia though either.  So I had like for her to consider seeing a general surgeon to see if they still think it could be a hernia even though again it did not show up on the scan.  But they did not see anything else major.  They did note a small nodule in the liver that was less than 1 cm.  Again nothing worrisome.  The final report made a comment about a pulmonary nodule but they did not note a nodule in the text.  So we may have to call the imaging department at [facility] to find out if they did see a nodule in the lung or if they did not and it was a typo.

## 2023-05-02 NOTE — Addendum Note (Signed)
Addended by: Nani Gasser D on: 05/02/2023 07:51 AM   Modules accepted: Orders

## 2023-05-02 NOTE — Telephone Encounter (Signed)
Orders Placed This Encounter  Procedures   Ambulatory referral to General Surgery    Referral Priority:   Routine    Referral Type:   Surgical    Referral Reason:   Specialty Services Required    Requested Specialty:   General Surgery    Number of Visits Requested:   1

## 2023-05-13 ENCOUNTER — Encounter: Payer: Managed Care, Other (non HMO) | Admitting: Family Medicine

## 2023-06-07 ENCOUNTER — Encounter: Payer: Self-pay | Admitting: Family Medicine

## 2023-06-07 DIAGNOSIS — E282 Polycystic ovarian syndrome: Secondary | ICD-10-CM

## 2023-06-07 DIAGNOSIS — E118 Type 2 diabetes mellitus with unspecified complications: Secondary | ICD-10-CM

## 2023-06-07 MED ORDER — TIRZEPATIDE 15 MG/0.5ML ~~LOC~~ SOAJ: 15.0000 mg | SUBCUTANEOUS | 1 refills | Status: DC

## 2023-06-13 ENCOUNTER — Telehealth: Payer: Self-pay | Admitting: Family Medicine

## 2023-06-13 ENCOUNTER — Ambulatory Visit (INDEPENDENT_AMBULATORY_CARE_PROVIDER_SITE_OTHER): Payer: Managed Care, Other (non HMO) | Admitting: Family Medicine

## 2023-06-13 ENCOUNTER — Other Ambulatory Visit (HOSPITAL_COMMUNITY)
Admission: RE | Admit: 2023-06-13 | Discharge: 2023-06-13 | Disposition: A | Discharge status: Home / Self Care | Payer: Managed Care, Other (non HMO) | Source: Ambulatory Visit | Attending: Family Medicine | Admitting: Family Medicine

## 2023-06-13 ENCOUNTER — Encounter: Payer: Self-pay | Admitting: Family Medicine

## 2023-06-13 VITALS — BP 149/89 | HR 69 | Temp 98.4°F | Ht 63.0 in | Wt 276.0 lb

## 2023-06-13 DIAGNOSIS — Z124 Encounter for screening for malignant neoplasm of cervix: Secondary | ICD-10-CM | POA: Diagnosis present

## 2023-06-13 DIAGNOSIS — Z0000 Encounter for general adult medical examination without abnormal findings: Secondary | ICD-10-CM | POA: Diagnosis present

## 2023-06-13 LAB — POCT UA - MICROALBUMIN
Creatinine, POC: 200 mg/dL
Microalbumin Ur, POC: 80 mg/L

## 2023-06-13 NOTE — Progress Notes (Signed)
Complete physical exam  Patient: Deborah Carrillo   DOB: [birth year]   42 y.o. Female  MRN: [id number]  Subjective:    Chief Complaint  Patient presents with   Annual Exam    With pap    Deborah Carrillo is a 42 y.o. female who presents today for a complete physical exam. She reports consuming a general diet.  Has been going to the gym.   She generally feels well. She reports sleeping fairly well. She does not have additional problems to discuss today.    Most recent fall risk assessment:    10/22/2022    7:58 AM  Fall Risk   Falls in the past year? 0  Number falls in past yr: 0  Injury with Fall? 0  Risk for fall due to : No Fall Risks  Follow up Falls evaluation completed     Most recent depression screenings:    06/13/2023   10:35 AM 10/22/2022   11:37 AM  PHQ 2/9 Scores  PHQ - 2 Score 3 2  PHQ- 9 Score 16 14         Patient Care Team: Agapito Games, MD as PCP - General (Family Medicine)   Outpatient Medications Prior to Visit  Medication Sig   acetaminophen (TYLENOL) 650 MG CR tablet Take 1 tablet (650 mg total) by mouth every 8 (eight) hours as needed for pain.   amLODipine (NORVASC) 5 MG tablet Take 1 tablet (5 mg total) by mouth daily.   metoprolol succinate (TOPROL-XL) 100 MG 24 hr tablet Take 1 tablet (100 mg total) by mouth daily.   Norgestimate-Ethinyl Estradiol Triphasic (TRI-ESTARYLLA) 0.18/0.215/0.25 MG-35 MCG tablet Take 1 tablet by mouth daily.   tirzepatide (MOUNJARO) 15 MG/0.5ML Pen Inject 15 mg into the skin once a week.   No facility-administered medications prior to visit.    ROS        Objective:     BP (!) 149/89   Pulse 69   Temp 98.4 F (36.9 C) (Oral)   Ht 5\' 3"  (1.6 m)   Wt 276 lb (125.2 kg)   LMP 05/30/2023 (Approximate)   SpO2 97%   BMI 48.89 kg/m     Physical Exam Vitals and nursing note reviewed. Exam conducted with a chaperone present.  Constitutional:      Appearance: Normal appearance.  HENT:     Head:  Normocephalic and atraumatic.     Right Ear: Tympanic membrane, ear canal and external ear normal.     Left Ear: Tympanic membrane, ear canal and external ear normal.     Nose: Nose normal.     Mouth/Throat:     Pharynx: Oropharynx is clear.  Eyes:     Extraocular Movements: Extraocular movements intact.     Conjunctiva/sclera: Conjunctivae normal.     Pupils: Pupils are equal, round, and reactive to light.  Neck:     Thyroid: No thyromegaly.  Cardiovascular:     Rate and Rhythm: Normal rate and regular rhythm.  Pulmonary:     Effort: Pulmonary effort is normal.     Breath sounds: Normal breath sounds.  Chest:     Chest wall: No mass or deformity.  Breasts:    Right: Normal. No mass, nipple discharge or skin change.     Left: Normal. No mass, nipple discharge or skin change.  Abdominal:     General: Bowel sounds are normal.     Palpations: Abdomen is soft.     Tenderness: There is no  abdominal tenderness.  Genitourinary:    General: Normal vulva.     Exam position: Supine.     Pubic Area: No rash.      Labia:        Right: No rash, lesion or injury.        Left: No rash, lesion or injury.      Vagina: Normal.     Cervix: Normal.     Uterus: Normal.      Adnexa: Right adnexa normal and left adnexa normal.     Rectum: Normal.  Musculoskeletal:        General: No swelling.     Cervical back: Neck supple.  Lymphadenopathy:     Upper Body:     Right upper body: No supraclavicular, axillary or pectoral adenopathy.     Left upper body: No supraclavicular, axillary or pectoral adenopathy.  Skin:    General: Skin is warm and dry.  Neurological:     Mental Status: She is alert and oriented to person, place, and time.  Psychiatric:        Mood and Affect: Mood normal.        Behavior: Behavior normal.      Results for orders placed or performed in visit on 06/13/23  POCT UA - Microalbumin  Result Value Ref Range   Microalbumin Ur, POC 80 mg/L   Creatinine, POC 200  mg/dL   Albumin/Creatinine Ratio, Urine, POC 30-300    Last CBC Lab Results  Component Value Date   WBC 8.2 03/12/2022   HGB 12.6 03/12/2022   HCT 38.4 03/12/2022   MCV 81.5 03/12/2022   MCH 26.8 (L) 03/12/2022   RDW 14.4 03/12/2022   PLT 267 03/12/2022   Last metabolic panel Lab Results  Component Value Date   GLUCOSE 92 03/12/2022   NA 139 03/12/2022   K 3.7 03/12/2022   CL 106 03/12/2022   CO2 23 03/12/2022   BUN 11 03/12/2022   CREATININE 0.82 03/12/2022   EGFR 92 03/12/2022   CALCIUM 8.5 (L) 03/12/2022   PROT 7.0 03/12/2022   ALBUMIN 3.5 (L) 12/25/2016   BILITOT 0.4 03/12/2022   ALKPHOS 66 12/25/2016   AST 14 03/12/2022   ALT 19 03/12/2022   Last lipids Lab Results  Component Value Date   CHOL 191 03/12/2022   HDL 67 03/12/2022   LDLCALC 98 03/12/2022   TRIG 161 (H) 03/12/2022   CHOLHDL 2.9 03/12/2022        Assessment & Plan:    Routine Health Maintenance and Physical Exam  Immunization History  Administered Date(s) Administered   Influenza Split 08/22/2012   Influenza, Seasonal, Injecte, Preservative Fre 06/20/2018, 06/15/2019, 06/27/2020   Influenza,inj,Quad PF,6+ Mos 05/29/2013, 06/14/2015, 05/11/2016, 06/15/2019   PFIZER(Purple Top)SARS-COV-2 Vaccination 04/19/2020, 05/10/2020   PPD Test 08/22/2012, 12/25/2016   Pneumococcal Polysaccharide-23 10/24/2018   Rho (D) Immune Globulin 06/01/2011   Tdap 08/22/2012, 06/18/2022    Health Maintenance  Topic Date Due   Diabetic kidney evaluation - eGFR measurement  03/13/2023   INFLUENZA VACCINE  04/18/2023   HEMOGLOBIN A1C  04/22/2023   COVID-19 Vaccine (3 - 2023-24 season) 05/19/2023   Hepatitis C Screening  06/19/2023 (Originally 03/03/1998)   OPHTHALMOLOGY EXAM  07/26/2023   MAMMOGRAM  11/04/2023   Diabetic kidney evaluation - Urine ACR  06/12/2024   FOOT EXAM  06/12/2024   Cervical Cancer Screening (HPV/Pap Cotest)  12/13/2024   DTaP/Tdap/Td (3 - Td or Tdap) 06/18/2032   HIV Screening   Completed  Pneumococcal Vaccine 20-60 Years old  Aged Out   HPV VACCINES  Aged Out    Discussed health benefits of physical activity, and encouraged her to engage in regular exercise appropriate for her age and condition.  Problem List Items Addressed This Visit   None Visit Diagnoses     Wellness examination    -  Primary   Relevant Orders   CMP14+EGFR   Lipid panel   CBC   Hemoglobin A1c   Cytology - PAP   POCT UA - Microalbumin (Completed)   Screening for malignant neoplasm of cervix       Relevant Orders   Cytology - PAP      Return in about 4 months (around 10/13/2023) for Diabetes follow-up.   Keep up a regular exercise program and make sure you are eating a healthy diet Try to eat 4 servings of dairy a day, or if you are lactose intolerant take a calcium with vitamin D daily.  Your vaccines are up to date.   Diabetic Foot Exam - Simple   Simple Foot Form Diabetic Foot exam was performed with the following findings: Yes 06/13/2023 10:33 AM  Visual Inspection No deformities, no ulcerations, no other skin breakdown bilaterally: Yes Sensation Testing Intact to touch and monofilament testing bilaterally: Yes Pulse Check Posterior Tibialis and Dorsalis pulse intact bilaterally: Yes Comments    Will check on prior authorization needed for Mounjaro.    Nani Gasser, MD

## 2023-06-13 NOTE — Patient Instructions (Signed)
Please f/u in 2 weeks with nurse for bp check

## 2023-06-13 NOTE — Telephone Encounter (Signed)
Initiated prior [employer], can f you Initiated prior Auth 15 mg.  She is a continuation of therapy.

## 2023-06-14 ENCOUNTER — Telehealth: Payer: Self-pay

## 2023-06-14 LAB — CBC
Hematocrit: 38.4 % (ref 34.0–46.6)
Hemoglobin: 12.8 g/dL (ref 11.1–15.9)
MCH: 27.9 pg (ref 26.6–33.0)
MCHC: 33.3 g/dL (ref 31.5–35.7)
MCV: 84 fL (ref 79–97)
Platelets: 280 10*3/uL (ref 150–450)
RBC: 4.58 x10E6/uL (ref 3.77–5.28)
RDW: 12.6 % (ref 11.7–15.4)
WBC: 8.1 10*3/uL (ref 3.4–10.8)

## 2023-06-14 LAB — CMP14+EGFR
ALT: 19 [IU]/L (ref 0–32)
AST: 17 [IU]/L (ref 0–40)
Albumin: 3.9 g/dL (ref 3.9–4.9)
Alkaline Phosphatase: 77 [IU]/L (ref 44–121)
BUN/Creatinine Ratio: 17 (ref 9–23)
BUN: 14 mg/dL (ref 6–24)
Bilirubin Total: 0.5 mg/dL (ref 0.0–1.2)
CO2: 23 mmol/L (ref 20–29)
Calcium: 8.6 mg/dL — ABNORMAL LOW (ref 8.7–10.2)
Chloride: 100 mmol/L (ref 96–106)
Creatinine, Ser: 0.81 mg/dL (ref 0.57–1.00)
Globulin, Total: 3.1 g/dL (ref 1.5–4.5)
Glucose: 77 mg/dL (ref 70–99)
Potassium: 4.3 mmol/L (ref 3.5–5.2)
Sodium: 137 mmol/L (ref 134–144)
Total Protein: 7 g/dL (ref 6.0–8.5)
eGFR: 92 mL/min/{1.73_m2} (ref >59)

## 2023-06-14 LAB — HEMOGLOBIN A1C
Est. average glucose Bld gHb Est-mCnc: 114 mg/dL
Hgb A1c MFr Bld: 5.6 % (ref 4.8–5.6)

## 2023-06-14 LAB — LIPID PANEL
Chol/HDL Ratio: 2.5 {ratio} (ref 0.0–4.4)
Cholesterol, Total: 197 mg/dL (ref 100–199)
HDL: 78 mg/dL (ref >39)
LDL Chol Calc (NIH): 100 mg/dL — ABNORMAL HIGH (ref 0–99)
Triglycerides: 112 mg/dL (ref 0–149)
VLDL Cholesterol Cal: 19 mg/dL (ref 5–40)

## 2023-06-14 NOTE — Progress Notes (Signed)
Hi Deborah Carrillo, your calcium still just a little low similar to last time please make sure you are taking some extra calcium daily there is some great over-the-counter products with calcium and vitamin D all in 1.  Also work on a calcium rich diet if you do not eat a lot of dairy try to get some things like Allmond milk in your diet green leafy vegetables and nuts.  Cholesterol overall looks okay.  Blood count is normal.  A1c looks great at 5.6.  Back into the normal range.

## 2023-06-14 NOTE — Telephone Encounter (Addendum)
Initiated Prior authorization ZOX:[id number] 15MG /0.5ML pen-injectors Via: Covermymeds Case/Key:[id number] Status: approved  as of 06/14/23 Reason:Authorization Expiration Date: 06/12/2024  Notified Pt via: Mychart

## 2023-06-21 ENCOUNTER — Telehealth: Payer: Self-pay | Admitting: Family Medicine

## 2023-06-21 NOTE — Telephone Encounter (Signed)
Pt advised. She stated that she did get her [facility] and she will start once she gets cleared

## 2023-06-21 NOTE — Telephone Encounter (Signed)
Please call pt and let her know pap showed some low grade abnormal cells.    Based on ASCCP guidelines and prior pap being normal they recommend repeat in one year with HPV testing again.

## 2023-06-24 LAB — CYTOLOGY - PAP
Adequacy: ABSENT
Chlamydia: NEGATIVE
Comment: NEGATIVE
Comment: NEGATIVE
Comment: NEGATIVE
Comment: NEGATIVE
Comment: NORMAL
HPV 16: NEGATIVE
HPV 18 / 45: NEGATIVE
High risk HPV: POSITIVE — AB
Neisseria Gonorrhea: NEGATIVE

## 2023-06-25 NOTE — Progress Notes (Signed)
Hi Deborah Carrillo, Your pap did sow some slightly abnormal cells so recommendation is to repeat Pap and cotesting in one year.

## 2023-08-13 ENCOUNTER — Encounter: Payer: Self-pay | Admitting: Family Medicine

## 2023-08-13 DIAGNOSIS — K921 Melena: Secondary | ICD-10-CM

## 2023-08-13 NOTE — Telephone Encounter (Signed)
She states it is not coming from her urine. Referral has been placed.

## 2023-08-13 NOTE — Telephone Encounter (Signed)
Ok to place referral.  Please let her know that if she thinks that the urine is at all coming from the urine to let us know and we can always at least evaluate her for possible UTI especially if she had a catheter in during her surgical procedure.

## 2023-10-14 ENCOUNTER — Encounter: Payer: Self-pay | Admitting: Family Medicine

## 2023-10-14 ENCOUNTER — Ambulatory Visit: Payer: Managed Care, Other (non HMO) | Admitting: Family Medicine

## 2023-10-14 VITALS — BP 155/97 | HR 72 | Ht 63.0 in | Wt 268.0 lb

## 2023-10-14 DIAGNOSIS — E118 Type 2 diabetes mellitus with unspecified complications: Secondary | ICD-10-CM | POA: Diagnosis not present

## 2023-10-14 DIAGNOSIS — I10 Essential (primary) hypertension: Secondary | ICD-10-CM

## 2023-10-14 DIAGNOSIS — M170 Bilateral primary osteoarthritis of knee: Secondary | ICD-10-CM | POA: Diagnosis not present

## 2023-10-14 DIAGNOSIS — Z7985 Long-term (current) use of injectable non-insulin antidiabetic drugs: Secondary | ICD-10-CM | POA: Diagnosis not present

## 2023-10-14 LAB — POCT GLYCOSYLATED HEMOGLOBIN (HGB A1C): Hemoglobin A1C: 5.6 % (ref 4.0–5.6)

## 2023-10-14 NOTE — Assessment & Plan Note (Signed)
Scheduled to get injections at [facility].  Had good relief years ago when Dr. Karie Schwalbe did them.

## 2023-10-14 NOTE — Assessment & Plan Note (Addendum)
She is down another 8 lbs.  Doing well on Mounjaro 15mg . Sometimes skipping eating all day. Discussed strategies around getting in small amounts during the day esp since she is working out 5 days a week at the gym and doing resistance training now.  Had to give up piliates bc of cost and time of class. Marland Kitchen

## 2023-10-14 NOTE — Patient Instructions (Signed)
Please send in your BP readings over the next 2 weeks via mychart.

## 2023-10-14 NOTE — Assessment & Plan Note (Signed)
BP high today which is unusual for her.  Will check at work and let us know.

## 2023-10-14 NOTE — Progress Notes (Signed)
   Established Patient Office Visit  Subjective  Patient ID: Deborah Carrillo, female    DOB: 12-07-1980  Age: 43 y.o. MRN: [id number]  Chief Complaint  Patient presents with   Diabetes    HPI  Diabetes - no hypoglycemic events. No wounds or sores that are not healing well. No increased thirst or urination. Checking glucose at home. Taking medications as prescribed without any side effects. On Mounjaro 15mg     Hypertension- Pt denies chest pain, SOB, dizziness, or heart palpitations.  Taking meds as directed w/o problems.  Denies medication side effects.       ROS    Objective:     BP (!) 155/97   Pulse 72   Ht 5\' 3"  (1.6 m)   Wt 268 lb (121.6 kg)   SpO2 100%   BMI 47.47 kg/m    Physical Exam Vitals and nursing note reviewed.  Constitutional:      Appearance: Normal appearance.  HENT:     Head: Normocephalic and atraumatic.  Eyes:     Conjunctiva/sclera: Conjunctivae normal.  Cardiovascular:     Rate and Rhythm: Normal rate and regular rhythm.  Pulmonary:     Effort: Pulmonary effort is normal.     Breath sounds: Normal breath sounds.  Skin:    General: Skin is warm and dry.  Neurological:     Mental Status: She is alert.  Psychiatric:        Mood and Affect: Mood normal.      Results for orders placed or performed in visit on 10/14/23  POCT HgB A1C  Result Value Ref Range   Hemoglobin A1C 5.6 4.0 - 5.6 %   HbA1c POC (<> result, manual entry)     HbA1c, POC (prediabetic range)     HbA1c, POC (controlled diabetic range)        The 10-year ASCVD risk score (Arnett DK, et al., 2019) is: 1.4%    Assessment & Plan:   Problem List Items Addressed This Visit       Cardiovascular and Mediastinum   Essential hypertension (Chronic)   BP high today which is unusual for her.  Will check at work and let us know.          Endocrine   Controlled diabetes mellitus type 2 with complications, unspecified whether long term insulin use (HCC) - Primary    She is down another 8 lbs.  Doing well on Mounjaro 15mg . Sometimes skipping eating all day. Discussed strategies around getting in small amounts during the day esp since she is working out 5 days a week at the gym and doing resistance training now.  Had to give up piliates bc of cost and time of class. .        Relevant Orders   POCT HgB A1C (Completed)     Musculoskeletal and Integument   Primary osteoarthritis of both knees   Scheduled to get injections at [facility].  Had good relief years ago when Dr. Karie Schwalbe did them.         Return in about 4 months (around 02/11/2024) for Diabetes follow-up.    Nani Gasser, MD

## 2023-10-16 ENCOUNTER — Telehealth: Payer: Self-pay

## 2023-10-16 DIAGNOSIS — I10 Essential (primary) hypertension: Secondary | ICD-10-CM

## 2023-10-16 MED ORDER — AMLODIPINE BESYLATE 10 MG PO TABS: 10.0000 mg | ORAL_TABLET | Freq: Every day | ORAL | 0 refills | Status: DC

## 2023-10-16 NOTE — Telephone Encounter (Signed)
Copied from CRM [id number]. Topic: Clinical - Medical Advice >> Oct 16, 2023  1:22 PM Nila Nephew wrote: Reason for CRM: Patient states she has been monitoring her blood pressure as directed today and has found that it has been high today as well. Patient seeking advice on next steps.  160/96 - This morning at 6AM 164/105 - This afternoon at 1 PM

## 2023-10-16 NOTE — Telephone Encounter (Signed)
Okay, bump up the amlodipine to 10 mg I will go ahead and send in a new prescription to be shipped to her house and in the meantime can take 2 of the 5 mg and see if the blood pressure starts to improve over the next 1 to 2 weeks.  Make sure you are eating less than 2000 mg of sodium daily and avoid caffeine is much as possible because that can bump up the blood pressure.  Try to avoid any decongestants as well.

## 2023-10-17 ENCOUNTER — Encounter: Payer: Self-pay | Admitting: Family Medicine

## 2023-10-17 NOTE — Telephone Encounter (Signed)
Please see response yesterday.

## 2023-10-17 NOTE — Telephone Encounter (Signed)
Patient advised.

## 2023-10-17 NOTE — Telephone Encounter (Signed)
See telephone message

## 2023-12-04 ENCOUNTER — Ambulatory Visit: Payer: Managed Care, Other (non HMO) | Admitting: Family Medicine

## 2023-12-17 ENCOUNTER — Other Ambulatory Visit: Payer: Self-pay | Admitting: Family Medicine

## 2023-12-17 ENCOUNTER — Encounter: Payer: Self-pay | Admitting: Family Medicine

## 2023-12-17 DIAGNOSIS — E118 Type 2 diabetes mellitus with unspecified complications: Secondary | ICD-10-CM

## 2023-12-17 DIAGNOSIS — E282 Polycystic ovarian syndrome: Secondary | ICD-10-CM

## 2024-01-02 ENCOUNTER — Encounter: Payer: Self-pay | Admitting: Family Medicine

## 2024-01-14 NOTE — Telephone Encounter (Signed)
 I am not really sure.  If it is severe then she can go to the emergency room but I do not know what they are going to do for her that is different.  But I am happy to place a new referral for a second opinion if she would like.

## 2024-02-12 ENCOUNTER — Ambulatory Visit: Payer: Managed Care, Other (non HMO) | Admitting: Family Medicine

## 2024-04-27 ENCOUNTER — Other Ambulatory Visit: Payer: Self-pay | Admitting: Family Medicine

## 2024-04-27 DIAGNOSIS — I10 Essential (primary) hypertension: Secondary | ICD-10-CM

## 2024-04-27 DIAGNOSIS — E282 Polycystic ovarian syndrome: Secondary | ICD-10-CM

## 2024-05-01 ENCOUNTER — Other Ambulatory Visit: Payer: Self-pay | Admitting: Family Medicine

## 2024-05-01 DIAGNOSIS — I10 Essential (primary) hypertension: Secondary | ICD-10-CM

## 2024-05-18 DIAGNOSIS — K5190 Ulcerative colitis, unspecified, without complications: Secondary | ICD-10-CM | POA: Insufficient documentation

## 2024-05-19 ENCOUNTER — Encounter: Payer: Self-pay | Admitting: Sports Medicine

## 2024-05-21 ENCOUNTER — Encounter: Payer: Self-pay | Admitting: Family Medicine

## 2024-05-21 DIAGNOSIS — I10 Essential (primary) hypertension: Secondary | ICD-10-CM

## 2024-05-21 DIAGNOSIS — E282 Polycystic ovarian syndrome: Secondary | ICD-10-CM

## 2024-05-21 DIAGNOSIS — E118 Type 2 diabetes mellitus with unspecified complications: Secondary | ICD-10-CM

## 2024-05-22 MED ORDER — NORETHINDRONE 0.35 MG PO TABS: 1.0000 | ORAL_TABLET | Freq: Every day | ORAL | 11 refills | Status: DC

## 2024-05-22 MED ORDER — AMLODIPINE BESYLATE 10 MG PO TABS: 10.0000 mg | ORAL_TABLET | Freq: Every day | ORAL | 0 refills | Status: DC

## 2024-05-25 ENCOUNTER — Other Ambulatory Visit: Payer: Self-pay | Admitting: Family Medicine

## 2024-05-25 ENCOUNTER — Telehealth: Payer: Self-pay

## 2024-05-25 DIAGNOSIS — E118 Type 2 diabetes mellitus with unspecified complications: Secondary | ICD-10-CM

## 2024-05-25 DIAGNOSIS — E282 Polycystic ovarian syndrome: Secondary | ICD-10-CM

## 2024-05-25 NOTE — Transitions of Care (Post Inpatient/ED Visit) (Signed)
   05/25/2024  Name: Chandy Tarman MRN: [id number] DOB: [birth year]  Today's TOC FU Call Status: Today's TOC FU Call Status:: Successful TOC FU Call Completed TOC FU Call Complete Date:  (Spoke with patient who declined (has returned to work and is at work now) and states she will follow up with PCP and GI and does not feel there is a need for [facility]) Patient's Name and Date of Birth confirmed.  Transition Care Management Follow-up Telephone Call Date of Discharge: 05/22/24 Discharge Facility: Other ([facility]) Name of Other ([facility]) Discharge Facility: [facility] Type of Discharge: Inpatient Admission Primary Inpatient Discharge Diagnosis:: Ulcerative (chronic) enterocolitis, other complication  Items Reviewed: PCP/GI hospital follow up appointments   Shona Prow RN, CCM [place]  VBCI-Population Health RN Care Manager [phone]

## 2024-05-26 NOTE — Telephone Encounter (Signed)
 Appointment required for refills. Please call pt to schedule an appointment

## 2024-06-03 MED ORDER — MOUNJARO 15 MG/0.5ML ~~LOC~~ SOAJ: SUBCUTANEOUS | 1 refills | Status: AC

## 2024-06-03 NOTE — Telephone Encounter (Signed)
 Patient requesting to restart Mounjaro  Last written as Mounjaro  15mg  Last OV 10/14/2023 Upcoming appt = none

## 2024-06-03 NOTE — Addendum Note (Signed)
 Addended by: Samyrah Bruster P on: 06/03/2024 01:19 PM   Modules accepted: Orders

## 2024-06-05 NOTE — Telephone Encounter (Signed)
 Please have her schedule follow-up with me maybe in October and as she has had a lot going on so I do want to add to the stress but we do really need to get her in for a follow-up it has been greater than 6 months.

## 2024-06-05 NOTE — Telephone Encounter (Signed)
 Last read by Nat Do at 2:03PM on 06/05/2024.

## 2024-06-12 ENCOUNTER — Telehealth: Payer: Self-pay

## 2024-06-12 ENCOUNTER — Other Ambulatory Visit (HOSPITAL_COMMUNITY): Payer: Self-pay

## 2024-06-12 NOTE — Telephone Encounter (Signed)
 Pharmacy Patient Advocate Encounter   Received notification from CoverMyMeds that prior authorization for Mounjaro  15MG /0.5ML auto-injectors is due for renewal.   Insurance verification completed.   The patient is insured through [facility].  Action: PA ARCHIVED. PER SEPARATE ENCOUNTER, PATIENT REQUIRES OFFICE VISIT FOR REFILLS.    Key: [id number]

## 2024-08-04 ENCOUNTER — Encounter: Payer: Self-pay | Admitting: Family Medicine

## 2024-08-11 ENCOUNTER — Encounter: Admitting: Family Medicine

## 2024-08-24 ENCOUNTER — Other Ambulatory Visit: Payer: Self-pay | Admitting: Family Medicine

## 2024-08-24 DIAGNOSIS — I10 Essential (primary) hypertension: Secondary | ICD-10-CM

## 2024-09-21 ENCOUNTER — Ambulatory Visit (INDEPENDENT_AMBULATORY_CARE_PROVIDER_SITE_OTHER): Admitting: Family Medicine

## 2024-09-21 ENCOUNTER — Encounter: Payer: Self-pay | Admitting: Family Medicine

## 2024-09-21 ENCOUNTER — Other Ambulatory Visit (HOSPITAL_COMMUNITY)
Admission: RE | Admit: 2024-09-21 | Discharge: 2024-09-21 | Disposition: A | Source: Ambulatory Visit | Attending: Family Medicine | Admitting: Family Medicine

## 2024-09-21 VITALS — BP 137/75 | HR 63 | Ht 63.0 in | Wt 270.0 lb

## 2024-09-21 DIAGNOSIS — Z0000 Encounter for general adult medical examination without abnormal findings: Secondary | ICD-10-CM | POA: Diagnosis not present

## 2024-09-21 DIAGNOSIS — Z124 Encounter for screening for malignant neoplasm of cervix: Secondary | ICD-10-CM | POA: Diagnosis not present

## 2024-09-21 DIAGNOSIS — Z1231 Encounter for screening mammogram for malignant neoplasm of breast: Secondary | ICD-10-CM | POA: Diagnosis not present

## 2024-09-21 DIAGNOSIS — K51919 Ulcerative colitis, unspecified with unspecified complications: Secondary | ICD-10-CM

## 2024-09-21 NOTE — Addendum Note (Signed)
 Addended by: FREYA BASCOM CROME on: 09/21/2024 04:15 PM   Modules accepted: Orders

## 2024-09-21 NOTE — Assessment & Plan Note (Signed)
 Now on Rinvoq and follows with [facility]

## 2024-09-21 NOTE — Progress Notes (Signed)
 "  Complete physical exam  Patient: Deborah AvarittFemale    DOB: 11-24-1980 44 y.o.   MRN: [id number]  Chief Complaint  Patient presents with   Annual Exam    Subjective:    Deborah Carrillo is a 44 y.o. female who presents today for a complete physical exam. She reports consuming a general diet. Exercises for 3 days per week for 30 min.   She generally feels well.  She does not have additional problems to discuss today.   Discussed the use of AI scribe software for clinical note transcription with the patient, who gave verbal consent to proceed.  History of Present Illness Deborah Carrillo is a 44 year old female with ulcerative colitis who presents for an annual physical exam.  Ulcerative colitis - Diagnosed after persistent gastrointestinal symptoms including frequent bathroom use and inability to stop. - Underwent three colonoscopies over six months prior to diagnosis. - Initially treated with infusions; condition worsened with fecal calprotectin levels rising to 5000. - Hospitalized for one week in September due to severe inflammation and intestinal narrowing. - Currently on Rinvoq, started at 45 mg for two months, now on 15 mg. - Fecal calprotectin levels have decreased to 3. - Family history of ulcerative colitis in biological father.  Dehydration - Recent blood work shows elevated kidney function levels attributed to dehydration. - Difficulty maintaining adequate hydration and frequently forgets to drink fluids.  Knee pain - Receives gel injections every six months for knee pain. - Occasionally takes Aleve for symptom relief. - Trial of Voltaren gel was ineffective.  Contraceptive use - Recently restarted progestin-only birth control pill due to contraindication with estrogen. - Experiencing spotting since initiation of new birth control.  Preventive health maintenance - Past mammogram and eye exam were normal. - Received recent influenza vaccination. - Recently resumed  use of tanning booth.   Most recent fall risk assessment:    09/21/2024    2:12 PM  Fall Risk   Falls in the past year? 0  Number falls in past yr: 0  Injury with Fall? 0  Risk for fall due to : No Fall Risks  Follow up Falls evaluation completed     Most recent depression screenings:    09/21/2024    2:12 PM 10/14/2023    7:56 AM  PHQ 2/9 Scores  PHQ - 2 Score 1 2  PHQ- 9 Score 11 12      Data saved with a previous flowsheet row definition        Patient Care Team: Alvan Dorothyann BIRCH, MD as PCP - General (Family Medicine)   ROS    Objective:    BP 137/75   Pulse 63   Ht 5' 3 (1.6 m)   Wt 270 lb (122.5 kg)   SpO2 100%   BMI 47.83 kg/m     Physical Exam Exam conducted with a chaperone present.  Constitutional:      Appearance: Normal appearance.  HENT:     Head: Normocephalic and atraumatic.     Right Ear: Tympanic membrane, ear canal and external ear normal.     Left Ear: Tympanic membrane, ear canal and external ear normal.     Nose: Nose normal.     Mouth/Throat:     Pharynx: Oropharynx is clear.  Eyes:     Extraocular Movements: Extraocular movements intact.     Conjunctiva/sclera: Conjunctivae normal.     Pupils: Pupils are equal, round, and reactive to light.  Neck:  Thyroid: No thyromegaly.  Cardiovascular:     Rate and Rhythm: Normal rate and regular rhythm.  Pulmonary:     Effort: Pulmonary effort is normal.     Breath sounds: Normal breath sounds.  Chest:     Chest wall: No mass or deformity.  Breasts:    Right: Normal. No mass, nipple discharge or skin change.     Left: Normal. No mass, nipple discharge or skin change.  Abdominal:     General: Bowel sounds are normal.     Palpations: Abdomen is soft.     Tenderness: There is no abdominal tenderness.  Genitourinary:    General: Normal vulva.     Exam position: Supine.     Pubic Area: No rash.      Labia:        Right: No rash, lesion or injury.        Left: No rash, lesion  or injury.      Vagina: Normal.     Cervix: Normal.     Uterus: Normal.      Adnexa: Right adnexa normal and left adnexa normal.     Rectum: Normal.  Musculoskeletal:        General: No swelling.     Cervical back: Neck supple.  Lymphadenopathy:     Upper Body:     Right upper body: No supraclavicular, axillary or pectoral adenopathy.     Left upper body: No supraclavicular, axillary or pectoral adenopathy.  Skin:    General: Skin is warm and dry.  Neurological:     Mental Status: She is oriented to person, place, and time.  Psychiatric:        Mood and Affect: Mood normal.        Behavior: Behavior normal.       No results found for any visits on 09/21/24.       Assessment & Plan:    Routine Health Maintenance and Physical Exam Immunization History  Administered Date(s) Administered   Influenza Split 08/22/2012   Influenza, Seasonal, Injecte, Preservative Fre 06/20/2018, 06/15/2019, 06/27/2020   Influenza,inj,Quad PF,6+ Mos 05/29/2013, 06/14/2015, 05/11/2016, 06/20/2018, 06/15/2019, 06/27/2020, 07/06/2021, 07/04/2022   Influenza-Unspecified 07/04/2024   PFIZER(Purple Top)SARS-COV-2 Vaccination 04/19/2020, 05/10/2020   PPD Test 08/22/2012, 12/25/2016   Pneumococcal Polysaccharide-23 10/24/2018   Rho (D) Immune Globulin  06/01/2011   Tdap 08/22/2012, 06/18/2022    Health Maintenance  Topic Date Due   Hepatitis C Screening  Never done   Hepatitis B Vaccines 19-59 Average Risk (1 of 3 - 19+ 3-dose series) Never done   HPV VACCINES (1 - 3-dose SCDM series) Never done   Pneumococcal Vaccine (2 of 2 - PCV) 10/25/2019   OPHTHALMOLOGY EXAM  07/26/2023   Mammogram  11/04/2023   HEMOGLOBIN A1C  04/12/2024   COVID-19 Vaccine (3 - 2025-26 season) 05/18/2024   Cervical Cancer Screening (Pap smear)  06/12/2024   Diabetic kidney evaluation - eGFR measurement  06/12/2024   Diabetic kidney evaluation - Urine ACR  06/12/2024   FOOT EXAM  09/21/2025   DTaP/Tdap/Td (3 - Td or  Tdap) 06/18/2032   Influenza Vaccine  Completed   HIV Screening  Completed   Meningococcal B Vaccine  Aged Out    Discussed health benefits of physical activity, and encouraged her to engage in regular exercise appropriate for her age and condition.  Problem List Items Addressed This Visit       Digestive   Ulcerative colitis (HCC)   Now on Rinvoq and follows with  [facility]       Other Visit Diagnoses       Routine general medical examination at a health care facility    -  Primary   Relevant Orders   HgB A1c   CMP14+EGFR   Lipid Panel With LDL/HDL Ratio   CBC with Differential   Urine Microalbumin w/creat. ratio     Cervical cancer screening       Relevant Orders   Cytology - PAP       Assessment and Plan Assessment & Plan Woman's Wellness Visit Routine wellness visit with focus on hydration and recent screenings. - Ordered blood work, avoiding duplicate tests from recent digestive health evaluation. - Reviewed recent mammogram results from [facility]. - Requested recent eye exam records from [facility].  Ulcerative colitis Managed with Rinvoq, transitioning from high dose to maintenance dose. Previous infusions were ineffective and worsened symptoms. Recent fecal calprotectin levels decreased significantly, indicating improved inflammation control. - Continue Rinvoq at maintenance dose. - Monitor fecal calprotectin levels.  Type 2 diabetes mellitus Well-controlled with recent A1c within normal range.  Osteoarthritis of knee Chronic knee pain managed with Aleve despite advice against NSAIDs due to ulcerative colitis. Previous Voltaren gel was ineffective. Received two rounds of gel injections. Hesitant about knee replacement surgery. - Continue current pain management with Aleve as needed.  Lab Results  Component Value Date   HGBA1C 5.6 10/14/2023     Return in about 6 months (around 03/21/2025).    Dorothyann Byars, MD [facility]  Health Primary Care & Sports Medicine at [facility]    "

## 2024-09-22 LAB — CMP14+EGFR
ALT: 12 IU/L (ref 0–32)
AST: 17 IU/L (ref 0–40)
Albumin: 4.4 g/dL (ref 3.9–4.9)
Alkaline Phosphatase: 62 IU/L (ref 41–116)
BUN/Creatinine Ratio: 23 (ref 9–23)
BUN: 17 mg/dL (ref 6–24)
Bilirubin Total: 0.5 mg/dL (ref 0.0–1.2)
CO2: 22 mmol/L (ref 20–29)
Calcium: 9 mg/dL (ref 8.7–10.2)
Chloride: 102 mmol/L (ref 96–106)
Creatinine, Ser: 0.75 mg/dL (ref 0.57–1.00)
Globulin, Total: 2.8 g/dL (ref 1.5–4.5)
Glucose: 89 mg/dL (ref 70–99)
Potassium: 4.3 mmol/L (ref 3.5–5.2)
Sodium: 137 mmol/L (ref 134–144)
Total Protein: 7.2 g/dL (ref 6.0–8.5)
eGFR: 101 mL/min/1.73

## 2024-09-22 LAB — CBC WITH DIFFERENTIAL/PLATELET
Basophils Absolute: 0.1 x10E3/uL (ref 0.0–0.2)
Basos: 1 %
EOS (ABSOLUTE): 0.1 x10E3/uL (ref 0.0–0.4)
Eos: 1 %
Hematocrit: 34.9 % (ref 34.0–46.6)
Hemoglobin: 11.5 g/dL (ref 11.1–15.9)
Immature Grans (Abs): 0 x10E3/uL (ref 0.0–0.1)
Immature Granulocytes: 0 %
Lymphocytes Absolute: 3.3 x10E3/uL — ABNORMAL HIGH (ref 0.7–3.1)
Lymphs: 38 %
MCH: 28.9 pg (ref 26.6–33.0)
MCHC: 33 g/dL (ref 31.5–35.7)
MCV: 88 fL (ref 79–97)
Monocytes Absolute: 0.6 x10E3/uL (ref 0.1–0.9)
Monocytes: 7 %
Neutrophils Absolute: 4.7 x10E3/uL (ref 1.4–7.0)
Neutrophils: 53 %
Platelets: 444 x10E3/uL (ref 150–450)
RBC: 3.98 x10E6/uL (ref 3.77–5.28)
RDW: 14.3 % (ref 11.7–15.4)
WBC: 8.8 x10E3/uL (ref 3.4–10.8)

## 2024-09-22 LAB — MICROALBUMIN / CREATININE URINE RATIO
Creatinine, Urine: 457 mg/dL
Microalb/Creat Ratio: 7 mg/g{creat} (ref 0–29)
Microalbumin, Urine: 30.8 ug/mL

## 2024-09-22 LAB — LIPID PANEL WITH LDL/HDL RATIO
Cholesterol, Total: 194 mg/dL (ref 100–199)
HDL: 59 mg/dL
LDL Chol Calc (NIH): 119 mg/dL — ABNORMAL HIGH (ref 0–99)
LDL/HDL Ratio: 2 ratio (ref 0.0–3.2)
Triglycerides: 91 mg/dL (ref 0–149)
VLDL Cholesterol Cal: 16 mg/dL (ref 5–40)

## 2024-09-22 LAB — HEMOGLOBIN A1C
Est. average glucose Bld gHb Est-mCnc: 105 mg/dL
Hgb A1c MFr Bld: 5.3 % (ref 4.8–5.6)

## 2024-09-22 LAB — SPECIMEN STATUS REPORT

## 2024-09-25 ENCOUNTER — Ambulatory Visit: Payer: Self-pay | Admitting: Family Medicine

## 2024-09-25 NOTE — Progress Notes (Signed)
 Hi Deborah Carrillo, LDL cholesterol up just a little bit continue to work on healthy Mediterranean diet and regular exercise.  Blood count looks good no sign of anemia.  A1c looks great at 5.3.  And metabolic panel including liver and kidney function is normal.  No excess protein in the urine which is great.

## 2024-09-28 LAB — CYTOLOGY - PAP
Adequacy: ABSENT
Chlamydia: NEGATIVE
Comment: NEGATIVE
Comment: NEGATIVE
Comment: NEGATIVE
Comment: NORMAL
Diagnosis: NEGATIVE
High risk HPV: NEGATIVE
Neisseria Gonorrhea: NEGATIVE
Trichomonas: NEGATIVE

## 2024-09-28 NOTE — Progress Notes (Signed)
 Your Pap smear is normal. You are negative for HPV as well. Repeat pap smear in 5 years.

## 2025-03-22 ENCOUNTER — Ambulatory Visit: Admitting: Family Medicine
